# Patient Record
Sex: Female | Born: 1995 | Race: Black or African American | Hispanic: No | Marital: Single | Smoking: Never smoker
Health system: Southern US, Community
[De-identification: ages and names within clinical notes are randomized; demographics above are authoritative.]

## PROBLEM LIST (undated history)

## (undated) DIAGNOSIS — F419 Anxiety disorder, unspecified: Secondary | ICD-10-CM

## (undated) DIAGNOSIS — R519 Headache, unspecified: Secondary | ICD-10-CM

## (undated) DIAGNOSIS — E611 Iron deficiency: Secondary | ICD-10-CM

## (undated) HISTORY — DX: Anxiety disorder, unspecified: F41.9

## (undated) HISTORY — PX: NO PAST SURGERIES: SHX2092

---

## 2019-05-16 ENCOUNTER — Emergency Department (HOSPITAL_COMMUNITY)
Admission: EM | Admit: 2019-05-16 | Discharge: 2019-05-16 | Disposition: A | Discharge status: Home / Self Care | Payer: Medicaid Other | Attending: Emergency Medicine | Admitting: Emergency Medicine

## 2019-05-16 ENCOUNTER — Emergency Department (HOSPITAL_COMMUNITY): Payer: Medicaid Other

## 2019-05-16 ENCOUNTER — Other Ambulatory Visit: Payer: Self-pay

## 2019-05-16 DIAGNOSIS — Y93I9 Activity, other involving external motion: Secondary | ICD-10-CM | POA: Insufficient documentation

## 2019-05-16 DIAGNOSIS — S46911A Strain of unspecified muscle, fascia and tendon at shoulder and upper arm level, right arm, initial encounter: Secondary | ICD-10-CM | POA: Diagnosis not present

## 2019-05-16 DIAGNOSIS — Y92410 Unspecified street and highway as the place of occurrence of the external cause: Secondary | ICD-10-CM | POA: Insufficient documentation

## 2019-05-16 DIAGNOSIS — M545 Low back pain, unspecified: Secondary | ICD-10-CM

## 2019-05-16 DIAGNOSIS — R519 Headache, unspecified: Secondary | ICD-10-CM | POA: Diagnosis not present

## 2019-05-16 DIAGNOSIS — Y999 Unspecified external cause status: Secondary | ICD-10-CM | POA: Insufficient documentation

## 2019-05-16 LAB — PREGNANCY, URINE: Preg Test, Ur: NEGATIVE

## 2019-05-16 MED ORDER — ACETAMINOPHEN 500 MG PO TABS
500.0000 mg | ORAL_TABLET | Freq: Once | ORAL | Status: AC
  Administered 2019-05-16: 500 mg via ORAL
  Filled 2019-05-16: qty 1

## 2019-05-16 MED ORDER — CYCLOBENZAPRINE HCL 10 MG PO TABS
10.0000 mg | ORAL_TABLET | Freq: Every day | ORAL | 0 refills | Status: DC
Start: 2019-05-16 — End: 2019-10-03
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
## 2019-05-16 NOTE — Discharge Instructions (Addendum)
Per our discussion, I am going to prescribe you a short course of Flexeril.  This is a muscle relaxer that is also a strong sedative.  You can take this once at night with dinner.  It will help with your pain as well as difficulty sleeping.  Please do not mix this medication with alcohol or operate a motor vehicle after taking it.  You can take Tylenol and ibuprofen as needed for management of your pain.  Please follow the instructions on the bottle.  I would also recommend Biofreeze and Voltaren gel.  You can apply these to the areas that are bothering you.  Please follow-up with your primary care provider regarding this visit as well as your  symptoms.  Feel free to return to the emergency department with any new or worsening symptoms.  It was a pleasure to meet you.

## 2019-05-16 NOTE — ED Notes (Signed)
Patient verbalizes understanding of discharge instructions. Opportunity for questioning and answers were provided. Armband removed by staff, pt discharged from ED ambulatory.   

## 2019-05-16 NOTE — ED Notes (Signed)
Pt transported to xray 

## 2019-06-27 ENCOUNTER — Other Ambulatory Visit: Payer: Self-pay

## 2019-06-27 ENCOUNTER — Encounter (HOSPITAL_COMMUNITY): Payer: Self-pay | Admitting: *Deleted

## 2019-06-27 ENCOUNTER — Emergency Department (HOSPITAL_COMMUNITY)
Admission: EM | Admit: 2019-06-27 | Discharge: 2019-06-27 | Disposition: A | Discharge status: Home / Self Care | Payer: Medicaid Other | Attending: Emergency Medicine | Admitting: Emergency Medicine

## 2019-06-27 DIAGNOSIS — Z5321 Procedure and treatment not carried out due to patient leaving prior to being seen by health care provider: Secondary | ICD-10-CM | POA: Insufficient documentation

## 2019-06-27 DIAGNOSIS — R2231 Localized swelling, mass and lump, right upper limb: Secondary | ICD-10-CM | POA: Diagnosis not present

## 2019-06-27 DIAGNOSIS — R200 Anesthesia of skin: Secondary | ICD-10-CM | POA: Diagnosis not present

## 2019-06-27 LAB — BASIC METABOLIC PANEL
Anion gap: 8 (ref 5–15)
BUN: 6 mg/dL (ref 6–20)
CO2: 25 mmol/L (ref 22–32)
Calcium: 9 mg/dL (ref 8.9–10.3)
Chloride: 106 mmol/L (ref 98–111)
Creatinine, Ser: 0.81 mg/dL (ref 0.44–1.00)
GFR calc Af Amer: >60 mL/min (ref >60)
GFR calc non Af Amer: >60 mL/min (ref >60)
Glucose, Bld: 86 mg/dL (ref 70–99)
Potassium: 3.3 mmol/L — ABNORMAL LOW (ref 3.5–5.1)
Sodium: 139 mmol/L (ref 135–145)

## 2019-06-27 LAB — CBC
HCT: 40.3 % (ref 36.0–46.0)
Hemoglobin: 13.2 g/dL (ref 12.0–15.0)
MCH: 29.7 pg (ref 26.0–34.0)
MCHC: 32.8 g/dL (ref 30.0–36.0)
MCV: 90.6 fL (ref 80.0–100.0)
Platelets: 347 10*3/uL (ref 150–400)
RBC: 4.45 MIL/uL (ref 3.87–5.11)
RDW: 12.4 % (ref 11.5–15.5)
WBC: 8.7 10*3/uL (ref 4.0–10.5)
nRBC: 0 % (ref 0.0–0.2)

## 2019-06-27 LAB — I-STAT BETA HCG BLOOD, ED (MC, WL, AP ONLY): I-stat hCG, quantitative: <5 m[IU]/mL (ref <5)

## 2019-06-27 MED ORDER — SODIUM CHLORIDE 0.9% FLUSH: 3.0000 mL | Freq: Once | INTRAVENOUS | Status: DC

## 2019-06-27 NOTE — ED Notes (Signed)
Pt LWBS. Pt stated she couldn't wait any longer.  

## 2019-06-27 NOTE — ED Triage Notes (Signed)
Pt reports that she has hx of same occurring, gets numbness to her right arm with swelling and also occ numbness to her right leg "like it falls asleep". In past this only occurred when pregnant, she would go to [facility] and get iv fluids then dc home. No acute distres is noted at triage.

## 2019-07-11 ENCOUNTER — Encounter (HOSPITAL_COMMUNITY): Payer: Self-pay | Admitting: Emergency Medicine

## 2019-07-11 ENCOUNTER — Other Ambulatory Visit: Payer: Self-pay

## 2019-07-11 ENCOUNTER — Emergency Department (HOSPITAL_COMMUNITY)
Admission: EM | Admit: 2019-07-11 | Discharge: 2019-07-11 | Disposition: A | Discharge status: Home / Self Care | Payer: Medicaid Other | Attending: Emergency Medicine | Admitting: Emergency Medicine

## 2019-07-11 ENCOUNTER — Emergency Department (HOSPITAL_COMMUNITY): Payer: Medicaid Other

## 2019-07-11 DIAGNOSIS — R1032 Left lower quadrant pain: Secondary | ICD-10-CM | POA: Insufficient documentation

## 2019-07-11 DIAGNOSIS — N10 Acute tubulo-interstitial nephritis: Secondary | ICD-10-CM | POA: Diagnosis not present

## 2019-07-11 DIAGNOSIS — N12 Tubulo-interstitial nephritis, not specified as acute or chronic: Secondary | ICD-10-CM

## 2019-07-11 DIAGNOSIS — R1031 Right lower quadrant pain: Secondary | ICD-10-CM | POA: Diagnosis present

## 2019-07-11 LAB — PREGNANCY, URINE: Preg Test, Ur: NEGATIVE

## 2019-07-11 LAB — CBC
HCT: 39.5 % (ref 36.0–46.0)
Hemoglobin: 13 g/dL (ref 12.0–15.0)
MCH: 29.5 pg (ref 26.0–34.0)
MCHC: 32.9 g/dL (ref 30.0–36.0)
MCV: 89.8 fL (ref 80.0–100.0)
Platelets: 260 10*3/uL (ref 150–400)
RBC: 4.4 MIL/uL (ref 3.87–5.11)
RDW: 12.5 % (ref 11.5–15.5)
WBC: 12 10*3/uL — ABNORMAL HIGH (ref 4.0–10.5)
nRBC: 0 % (ref 0.0–0.2)

## 2019-07-11 LAB — COMPREHENSIVE METABOLIC PANEL
ALT: 10 U/L (ref 0–44)
AST: 13 U/L — ABNORMAL LOW (ref 15–41)
Albumin: 3.3 g/dL — ABNORMAL LOW (ref 3.5–5.0)
Alkaline Phosphatase: 81 U/L (ref 38–126)
Anion gap: 8 (ref 5–15)
BUN: 5 mg/dL — ABNORMAL LOW (ref 6–20)
CO2: 22 mmol/L (ref 22–32)
Calcium: 8.6 mg/dL — ABNORMAL LOW (ref 8.9–10.3)
Chloride: 107 mmol/L (ref 98–111)
Creatinine, Ser: 0.84 mg/dL (ref 0.44–1.00)
GFR calc Af Amer: >60 mL/min (ref >60)
GFR calc non Af Amer: >60 mL/min (ref >60)
Glucose, Bld: 102 mg/dL — ABNORMAL HIGH (ref 70–99)
Potassium: 3.4 mmol/L — ABNORMAL LOW (ref 3.5–5.1)
Sodium: 137 mmol/L (ref 135–145)
Total Bilirubin: 1.2 mg/dL (ref 0.3–1.2)
Total Protein: 7.7 g/dL (ref 6.5–8.1)

## 2019-07-11 LAB — URINALYSIS, ROUTINE W REFLEX MICROSCOPIC
Bilirubin Urine: NEGATIVE
Glucose, UA: NEGATIVE mg/dL
Ketones, ur: NEGATIVE mg/dL
Nitrite: NEGATIVE
Protein, ur: 30 mg/dL — AB
Specific Gravity, Urine: 1.01 (ref 1.005–1.030)
WBC, UA: >50 WBC/hpf — ABNORMAL HIGH (ref 0–5)
pH: 7 (ref 5.0–8.0)

## 2019-07-11 LAB — GRAM STAIN: Gram Stain: NONE SEEN

## 2019-07-11 MED ORDER — ACETAMINOPHEN 500 MG PO TABS
1000.0000 mg | ORAL_TABLET | Freq: Once | ORAL | Status: AC
  Administered 2019-07-11: 1000 mg via ORAL
  Filled 2019-07-11: qty 2

## 2019-07-11 MED ORDER — LACTATED RINGERS IV BOLUS
1000.0000 mL | Freq: Once | INTRAVENOUS | Status: AC
  Administered 2019-07-11: 1000 mL via INTRAVENOUS

## 2019-07-11 MED ORDER — MORPHINE SULFATE (PF) 4 MG/ML IV SOLN
4.0000 mg | Freq: Once | INTRAVENOUS | Status: AC
  Administered 2019-07-11: 4 mg via INTRAVENOUS
  Filled 2019-07-11: qty 1

## 2019-07-11 MED ORDER — CEPHALEXIN 500 MG PO CAPS
500.0000 mg | ORAL_CAPSULE | Freq: Four times a day (QID) | ORAL | 0 refills | Status: AC
Start: 2019-07-11 — End: 2019-07-18

## 2019-07-11 NOTE — ED Notes (Signed)
Patient transported to CT 

## 2019-07-11 NOTE — ED Notes (Signed)
Patient verbalizes understanding of discharge instructions. Opportunity for questioning and answers were provided. Armband removed by staff, pt discharged from ED to home 

## 2019-07-11 NOTE — ED Triage Notes (Signed)
Bilateral kidney pain with burning sensation and pain with urination.

## 2019-07-11 NOTE — ED Provider Notes (Signed)
[facility] EMERGENCY DEPARTMENT Provider Note   CSN: [id number] Arrival date & time: 07/11/19  1210     History Chief Complaint  Patient presents with  . Flank Pain    Bailey [name] is a 24 y.o. female.  The history is provided by the patient.  Flank Pain This is a new problem. The current episode started more than 2 days ago (onset monday). The problem occurs constantly. The problem has been gradually worsening (current 10/10 pain). Associated symptoms include abdominal pain. Pertinent negatives include no chest pain, no headaches and no shortness of breath. Nothing relieves the symptoms. She has tried acetaminophen for the symptoms. The treatment provided mild relief.  Dysuria Pain quality:  Burning Duration:  5 days Timing:  Constant Chronicity:  New Urinary symptoms: discolored urine, foul-smelling urine and hematuria   Associated symptoms: abdominal pain, fever, flank pain and nausea   Associated symptoms: no vomiting   Associated symptoms comment:  Febrile on arrival to ED, denies measured fever earlier in week  Risk factors: hx of urolithiasis and sexually active   Risk factors comment:  Pregnancy status unknown  Hx of prior Renal stone ~3 yrs ago that did not require surgical intervention. Pt does endorse prior UTI with similar dysuria.      Past Medical History:  Diagnosis Date  . Anxiety     There are no problems to display for this patient.   History reviewed. No pertinent surgical history.   OB History   No obstetric history on file.     No family history on file.  Social History   Tobacco Use  . Smoking status: Never Smoker  Substance Use Topics  . Alcohol use: Yes  . Drug use: Yes    Types: Marijuana    Home Medications Prior to Admission medications   Medication Sig Start Date End Date Taking? Authorizing Provider  cephALEXin (KEFLEX) 500 MG capsule Take 1 capsule (500 mg total) by mouth 4 (four) times daily for 7  days. 07/11/19 07/18/19  Ramelo Oetken, Percell Belt, MD  cyclobenzaprine (FLEXERIL) 10 MG tablet Take 1 tablet (10 mg total) by mouth at bedtime. Patient not taking: Reported on 07/11/2019 05/16/19   Placido Sou, PA-C    Allergies    Blackberry [rubus fruticosus], Blueberry [vaccinium angustifolium], Other, Raspberry, and Strawberry extract  Review of Systems   Review of Systems  Constitutional: Positive for fever. Negative for chills.  HENT: Negative for congestion, rhinorrhea, sneezing and sore throat.   Respiratory: Negative for cough and shortness of breath.   Cardiovascular: Negative for chest pain and leg swelling.  Gastrointestinal: Positive for abdominal pain, constipation and nausea. Negative for blood in stool and vomiting.  Genitourinary: Positive for dysuria, flank pain and hematuria. Negative for vaginal bleeding.  Musculoskeletal: Positive for back pain.  Skin: Negative for rash and wound.  Neurological: Negative for dizziness and headaches.  Psychiatric/Behavioral: Negative.     Physical Exam Updated Vital Signs BP 116/69 (BP Location: Right Arm)   Pulse 86   Temp 99 F (37.2 C) (Oral)   Resp 18   Ht 5\' 5"  (1.651 m)   Wt 112.9 kg   LMP 06/20/2019   SpO2 99%   BMI 41.44 kg/m    Physical Exam Vitals and nursing note reviewed.  Constitutional:      General: She is not in acute distress.    Appearance: Normal appearance. She is well-developed. She is obese. She is not ill-appearing, toxic-appearing or diaphoretic.  HENT:  Head: Normocephalic and atraumatic.     Mouth/Throat:     Mouth: Mucous membranes are moist.  Eyes:     Conjunctiva/sclera: Conjunctivae normal.  Cardiovascular:     Rate and Rhythm: Normal rate and regular rhythm.     Heart sounds: No murmur heard.   Pulmonary:     Effort: Pulmonary effort is normal. No respiratory distress.     Breath sounds: Normal breath sounds.  Abdominal:     General: There is no distension.     Palpations: Abdomen is  soft.     Tenderness: There is abdominal tenderness. There is right CVA tenderness and left CVA tenderness. There is no guarding or rebound.  Musculoskeletal:     Cervical back: Neck supple.     Right lower leg: No edema.     Left lower leg: No edema.  Skin:    General: Skin is warm and dry.     Findings: No rash.  Neurological:     General: No focal deficit present.     Mental Status: She is alert. Mental status is at baseline.  Psychiatric:        Mood and Affect: Mood normal.        Behavior: Behavior normal.     ED Results / Procedures / Treatments   Labs (all labs ordered are listed, but only abnormal results are displayed) Labs Reviewed  COMPREHENSIVE METABOLIC PANEL - Abnormal; Notable for the following components:      Result Value   Potassium 3.4 (*)    Glucose, Bld 102 (*)    BUN 5 (*)    Calcium 8.6 (*)    Albumin 3.3 (*)    AST 13 (*)    All other components within normal limits  CBC - Abnormal; Notable for the following components:   WBC 12.0 (*)    All other components within normal limits  URINALYSIS, ROUTINE W REFLEX MICROSCOPIC - Abnormal; Notable for the following components:   APPearance HAZY (*)    Hgb urine dipstick MODERATE (*)    Protein, ur 30 (*)    Leukocytes,Ua LARGE (*)    WBC, UA >50 (*)    Bacteria, UA RARE (*)    All other components within normal limits  GRAM STAIN  URINE CULTURE  PREGNANCY, URINE    EKG None  Radiology CT ABDOMEN PELVIS WO CONTRAST  Result Date: 07/11/2019 CLINICAL DATA:  Bilateral kidney pain with burning sensation and pain with urination EXAM: CT ABDOMEN AND PELVIS WITHOUT CONTRAST TECHNIQUE: Multidetector CT imaging of the abdomen and pelvis was performed following the standard protocol without IV contrast. Unenhanced CT was performed per clinician order. Lack of IV contrast limits sensitivity and specificity, especially for evaluation of abdominal/pelvic solid viscera. COMPARISON:  Lumbar radiographs 05/16/2019  FINDINGS: Lower chest: Solitary 4 mm ground-glass opacity in the right middle lobe (5/2), likely post infectious or inflammatory in a patient of this age. Lung bases otherwise clear. Normal heart size. No pericardial effusion. Hepatobiliary: No visible liver lesions. Normal attenuation. Smooth liver surface contour. Normal gallbladder and biliary tree without visible calcified gallstone. Pancreas: Unremarkable. No pancreatic ductal dilatation or surrounding inflammatory changes. Spleen: Normal in size without focal abnormality. Adrenals/Urinary Tract: Normal adrenal glands. Kidneys symmetric in size without visible concerning renal lesion. No significant perinephric stranding. No visible urolithiasis or hydronephrosis. Urinary bladder is unremarkable. Stomach/Bowel: Distal esophagus, stomach and duodenal sweep are unremarkable. No small bowel wall thickening or dilatation. No evidence of obstruction. A normal appendix  is visualized. No colonic dilatation or wall thickening. Vascular/Lymphatic: No significant vascular findings are present. No enlarged abdominal or pelvic lymph nodes. Reproductive: Anteverted uterus. No concerning adnexal lesions. Dominant follicle in the right ovary, no follow-up imaging recommended. Other: No abdominopelvic free fluid or free gas. No bowel containing hernias. Musculoskeletal: No acute osseous abnormality or suspicious osseous lesion. IMPRESSION: 1. No acute CT findings to explain the patient's symptoms. Specifically, no visible urolithiasis or hydronephrosis. 2. Solitary 4 mm ground-glass opacity in the right middle lobe, likely post infectious or inflammatory in a patient of this age. Consider correlation with chest radiograph. Electronically Signed   By: Kreg Shropshire M.D.   On: 07/11/2019 19:14    Procedures Procedures (including critical care time)  Medications Ordered in ED Medications  acetaminophen (TYLENOL) tablet 1,000 mg (1,000 mg Oral Given 07/11/19 1219)  lactated  ringers bolus 1,000 mL (0 mLs Intravenous Stopped 07/11/19 1852)  morphine 4 MG/ML injection 4 mg (4 mg Intravenous Given 07/11/19 1632)  morphine 4 MG/ML injection 4 mg (4 mg Intravenous Given 07/11/19 2003)    ED Course  I have reviewed the triage vital signs and the nursing notes.  Pertinent labs & imaging results that were available during my care of the patient were reviewed by me and considered in my medical decision making (see chart for details).    MDM Rules/Calculators/A&P                          Pt is an otherwise healthy 24 y.o. female who presents w/ b/l CVA tenderness, lower abdominal pain, b/l flank pain, concerning for possible kidney stone. Patient presents with dysuria and hematuria. Hx of one prior renal stone.   Pt febrile here to 100.8, otherwise HDS. Patient complaining of severe 10/10 pain. Physical exam significant for b/l CVA tenderness. Abdomen w generalized tenderness however soft, non-distended, no concern for peritonitis. No overlying rash concerning for shingles. Presentation concerning for UTI/pyelonephritis vs. renal stone. Other emergent causes of back pain/flank pain considered however would be inconsistent with history and physical, considered low risk.  Imaging obtained without evidence of stone , no hydronephrosis. Pain treated with IVF, IV pain medicine. Labs and imaging reviewed by myself and considered in medical decision making if ordered.  Imaging interpreted by radiology. UA consistent with infection, presence of fever on arrival concerning for pyelonephritis. CT imaging was not significant for perinephric stranding, abscess.   Will continue management for pyelonephritis as outpatient. Recommend follow-up with PCP for persistent symptoms.  Will discharge with rx keflex x7 days. Recommended considerable fluid hydration. Patient voices understanding. Strict return precautions given. Discharged to home in stable condition.   The plan for this patient was  discussed with Dr. Jeraldine Loots, who voiced agreement and who oversaw evaluation and treatment of this patient.   Final Clinical Impression(s) / ED Diagnoses Final diagnoses:  Pyelonephritis    Rx / DC Orders ED Discharge Orders         Ordered    cephALEXin (KEFLEX) 500 MG capsule  4 times daily     Discontinue  Reprint     07/11/19 Grace Isaac, MD 07/11/19 1308    Gerhard Munch, MD 07/12/19 [phone]

## 2019-07-14 LAB — URINE CULTURE: Culture: >=100000 — AB

## 2019-10-03 ENCOUNTER — Ambulatory Visit (HOSPITAL_COMMUNITY)
Admission: EM | Admit: 2019-10-03 | Discharge: 2019-10-03 | Disposition: A | Discharge status: Home / Self Care | Payer: Medicaid Other | Attending: Family Medicine | Admitting: Family Medicine

## 2019-10-03 ENCOUNTER — Encounter (HOSPITAL_COMMUNITY): Payer: Self-pay | Admitting: *Deleted

## 2019-10-03 ENCOUNTER — Other Ambulatory Visit: Payer: Self-pay

## 2019-10-03 DIAGNOSIS — N3090 Cystitis, unspecified without hematuria: Secondary | ICD-10-CM | POA: Diagnosis present

## 2019-10-03 DIAGNOSIS — N898 Other specified noninflammatory disorders of vagina: Secondary | ICD-10-CM | POA: Diagnosis present

## 2019-10-03 HISTORY — DX: Iron deficiency: E61.1

## 2019-10-03 LAB — POCT URINALYSIS DIPSTICK, ED / UC
Bilirubin Urine: NEGATIVE
Glucose, UA: NEGATIVE mg/dL
Hgb urine dipstick: NEGATIVE
Ketones, ur: NEGATIVE mg/dL
Nitrite: POSITIVE — AB
Protein, ur: NEGATIVE mg/dL
Specific Gravity, Urine: >=1.03 (ref 1.005–1.030)
Urobilinogen, UA: 0.2 mg/dL (ref 0.0–1.0)
pH: 6 (ref 5.0–8.0)

## 2019-10-03 MED ORDER — METRONIDAZOLE 500 MG PO TABS
500.0000 mg | ORAL_TABLET | Freq: Two times a day (BID) | ORAL | 0 refills | Status: DC
Start: 2019-10-03 — End: 2019-10-21

## 2019-10-03 MED ORDER — CEPHALEXIN 500 MG PO CAPS
500.0000 mg | ORAL_CAPSULE | Freq: Two times a day (BID) | ORAL | 0 refills | Status: DC
Start: 2019-10-03 — End: 2019-10-21

## 2019-10-03 NOTE — ED Provider Notes (Signed)
[facility]   [id number] 10/03/19 Arrival Time: 1103  ASSESSMENT & PLAN:  1. Vaginal itching   2. Cystitis     Reports negative gonorrhea and chlamydia testing yesterday. Desires empiric BV treatment and UTI treatment. Urine culture sent.   Discharge Instructions     We have sent testing for sexually transmitted infections as well as for BV and yeast. We will notify you of any positive results once they are received. If required, we will prescribe any medications you might need.  Please refrain from all sexual activity for at least the next seven days.     Without s/s of PID.  Labs Reviewed  POCT URINALYSIS DIPSTICK, ED / UC - Abnormal; Notable for the following components:      Result Value   Nitrite POSITIVE (*)    Leukocytes,Ua SMALL (*)    All other components within normal limits  CERVICOVAGINAL ANCILLARY ONLY    Pending: Unresulted Labs (From admission, onward)         None       Will notify of any positive results. Instructed to refrain from sexual activity for at least seven days.  Reviewed expectations re: course of current medical issues. Questions answered. Outlined signs and symptoms indicating need for more acute intervention. Patient verbalized understanding. After Visit Summary given.   SUBJECTIVE:  Bailey [name] is a 24 y.o. female who presents with complaint of vaginal itching and feeling like she has a UTI. H/O UTI; urgency and mild dysuria. No specific vaginal discharge. No specific aggravating or alleviating factors reported. Afebrile. No abdominal or pelvic pain. Normal PO intake wihout n/v. No genital rashes or lesions. Reports that she is sexually active with single female partner. OTC treatment: none; h/o BV with vaginal itching.   OBJECTIVE:  Vitals:   10/03/19 1324  BP: 120/68  Pulse: 90  Resp: 20  Temp: 98.2 F (36.8 C)  TempSrc: Oral  SpO2: 99%     General appearance: alert, cooperative, appears stated age  and no distress Lungs: unlabored respirations; speaks full sentences without difficulty Back: no CVA tenderness; FROM at waist Abdomen: soft, non-tender GU: deferred Skin: warm and dry Psychological: alert and cooperative; normal mood and affect.  Results for orders placed or performed during the hospital encounter of 10/03/19  POC Urinalysis dipstick  Result Value Ref Range   Glucose, UA NEGATIVE NEGATIVE mg/dL   Bilirubin Urine NEGATIVE NEGATIVE   Ketones, ur NEGATIVE NEGATIVE mg/dL   Specific Gravity, Urine >=1.030 1.005 - 1.030   Hgb urine dipstick NEGATIVE NEGATIVE   pH 6.0 5.0 - 8.0   Protein, ur NEGATIVE NEGATIVE mg/dL   Urobilinogen, UA 0.2 0.0 - 1.0 mg/dL   Nitrite POSITIVE (A) NEGATIVE   Leukocytes,Ua SMALL (A) NEGATIVE    Labs Reviewed  POCT URINALYSIS DIPSTICK, ED / UC - Abnormal; Notable for the following components:      Result Value   Nitrite POSITIVE (*)    Leukocytes,Ua SMALL (*)    All other components within normal limits  CERVICOVAGINAL ANCILLARY ONLY    Allergies  Allergen Reactions  . Blackberry [Rubus Fruticosus] Anaphylaxis  . Blueberry [Vaccinium Angustifolium] Anaphylaxis  . Other Anaphylaxis    ALLERGIC TO ALL BERRIES  . Raspberry Anaphylaxis  . Strawberry Extract Anaphylaxis    Past Medical History:  Diagnosis Date  . Anxiety   . Iron deficiency    Family History  Problem Relation Age of Onset  . Healthy Mother   . Healthy Father  Social History   Socioeconomic History  . Marital status: Single    Spouse name: Not on file  . Number of children: Not on file  . Years of education: Not on file  . Highest education level: Not on file  Occupational History  . Not on file  Tobacco Use  . Smoking status: Never Smoker  . Smokeless tobacco: Never Used  Substance and Sexual Activity  . Alcohol use: Yes    Comment: no drinks since August  . Drug use: Yes    Types: Marijuana  . Sexual activity: Yes    Birth control/protection:  None  Other Topics Concern  . Not on file  Social History Narrative  . Not on file   Social Determinants of Health   Financial Resource Strain:   . Difficulty of Paying Living Expenses: Not on file  Food Insecurity:   . Worried About Programme researcher, broadcasting/film/video in the Last Year: Not on file  . Ran Out of Food in the Last Year: Not on file  Transportation Needs:   . Lack of Transportation (Medical): Not on file  . Lack of Transportation (Non-Medical): Not on file  Physical Activity:   . Days of Exercise per Week: Not on file  . Minutes of Exercise per Session: Not on file  Stress:   . Feeling of Stress : Not on file  Social Connections:   . Frequency of Communication with Friends and Family: Not on file  . Frequency of Social Gatherings with Friends and Family: Not on file  . Attends Religious Services: Not on file  . Active Member of Clubs or Organizations: Not on file  . Attends Banker Meetings: Not on file  . Marital Status: Not on file  Intimate Partner Violence:   . Fear of Current or Ex-Partner: Not on file  . Emotionally Abused: Not on file  . Physically Abused: Not on file  . Sexually Abused: Not on file          Mardella Layman, MD 10/03/19 [phone]

## 2019-10-03 NOTE — ED Triage Notes (Addendum)
Patient in with complaints of urinary frequency and foul odor with urination. Patient states she has experienced vaginal itching x 3 days.  Patient states that she did she did have thick white vaginal discharge this morning.  Patient states that she went to the [facility] [place] on yesterday and was told that she was [redacted] weeks pregnant. Patient states she was checked for STDs on yesterday and was told that she was negative.Patient states that she has reoccurring UTIs. Patient states that she has a hx of BV and would like to checked for BV. Patient states that her boyfriend had bloody sperm that she came in contact with after ejaculation during sexual intercourse.

## 2019-10-03 NOTE — Discharge Instructions (Signed)
We have sent testing for sexually transmitted infections as well as for BV and yeast. We will notify you of any positive results once they are received. If required, we will prescribe any medications you might need.  Please refrain from all sexual activity for at least the next seven days.

## 2019-10-04 ENCOUNTER — Telehealth (HOSPITAL_COMMUNITY): Payer: Self-pay | Admitting: Emergency Medicine

## 2019-10-04 LAB — CERVICOVAGINAL ANCILLARY ONLY
Bacterial Vaginitis (gardnerella): POSITIVE — AB
Candida Glabrata: NEGATIVE
Candida Vaginitis: POSITIVE — AB
Chlamydia: NEGATIVE
Comment: NEGATIVE
Comment: NEGATIVE
Comment: NEGATIVE
Comment: NEGATIVE
Comment: NEGATIVE
Comment: NORMAL
Neisseria Gonorrhea: NEGATIVE
Trichomonas: NEGATIVE

## 2019-10-04 MED ORDER — TERCONAZOLE 0.4 % VA CREA: 1.0000 | TOPICAL_CREAM | Freq: Every day | VAGINAL | 0 refills | Status: AC

## 2019-10-05 LAB — URINE CULTURE: Culture: >=100000 — AB

## 2019-10-21 ENCOUNTER — Other Ambulatory Visit: Payer: Self-pay

## 2019-10-21 ENCOUNTER — Encounter (HOSPITAL_COMMUNITY): Payer: Self-pay

## 2019-10-21 ENCOUNTER — Inpatient Hospital Stay (HOSPITAL_COMMUNITY)
Admission: AD | Admit: 2019-10-21 | Discharge: 2019-10-21 | Disposition: A | Discharge status: Home / Self Care | Payer: Medicaid Other | Attending: Family Medicine | Admitting: Family Medicine

## 2019-10-21 DIAGNOSIS — R509 Fever, unspecified: Secondary | ICD-10-CM | POA: Diagnosis not present

## 2019-10-21 DIAGNOSIS — R55 Syncope and collapse: Secondary | ICD-10-CM | POA: Insufficient documentation

## 2019-10-21 DIAGNOSIS — Z3A10 10 weeks gestation of pregnancy: Secondary | ICD-10-CM

## 2019-10-21 DIAGNOSIS — U071 COVID-19: Secondary | ICD-10-CM | POA: Diagnosis not present

## 2019-10-21 DIAGNOSIS — O98511 Other viral diseases complicating pregnancy, first trimester: Secondary | ICD-10-CM | POA: Diagnosis not present

## 2019-10-21 DIAGNOSIS — R531 Weakness: Secondary | ICD-10-CM | POA: Diagnosis not present

## 2019-10-21 DIAGNOSIS — R109 Unspecified abdominal pain: Secondary | ICD-10-CM | POA: Diagnosis not present

## 2019-10-21 DIAGNOSIS — Z79899 Other long term (current) drug therapy: Secondary | ICD-10-CM | POA: Insufficient documentation

## 2019-10-21 DIAGNOSIS — O26891 Other specified pregnancy related conditions, first trimester: Secondary | ICD-10-CM | POA: Diagnosis not present

## 2019-10-21 LAB — RESPIRATORY PANEL BY RT PCR (FLU A&B, COVID)
Influenza A by PCR: NEGATIVE
Influenza B by PCR: NEGATIVE
SARS Coronavirus 2 by RT PCR: POSITIVE — AB

## 2019-10-21 LAB — WET PREP, GENITAL
Clue Cells Wet Prep HPF POC: NONE SEEN
Sperm: NONE SEEN
Trich, Wet Prep: NONE SEEN
Yeast Wet Prep HPF POC: NONE SEEN

## 2019-10-21 MED ORDER — EPINEPHRINE 0.3 MG/0.3ML IJ SOAJ
0.3000 mg | Freq: Once | INTRAMUSCULAR | Status: DC | PRN
  Filled 2019-10-21: qty 0.6

## 2019-10-21 MED ORDER — SODIUM CHLORIDE 0.9 % IV SOLN
Freq: Once | INTRAVENOUS | Status: AC
  Filled 2019-10-21: qty 20

## 2019-10-21 MED ORDER — METHYLPREDNISOLONE SODIUM SUCC 125 MG IJ SOLR: 125.0000 mg | Freq: Once | INTRAMUSCULAR | Status: DC | PRN

## 2019-10-21 MED ORDER — FAMOTIDINE IN NACL 20-0.9 MG/50ML-% IV SOLN: 20.0000 mg | Freq: Once | INTRAVENOUS | Status: DC | PRN

## 2019-10-21 MED ORDER — ALBUTEROL SULFATE HFA 108 (90 BASE) MCG/ACT IN AERS: 2.0000 | INHALATION_SPRAY | Freq: Once | RESPIRATORY_TRACT | Status: DC | PRN

## 2019-10-21 MED ORDER — SODIUM CHLORIDE 0.9 % IV SOLN
INTRAVENOUS | Status: DC | PRN
  Administered 2019-10-21: 1000 mL via INTRAVENOUS

## 2019-10-21 MED ORDER — ACETAMINOPHEN 500 MG PO TABS
1000.0000 mg | ORAL_TABLET | Freq: Once | ORAL | Status: AC
  Administered 2019-10-21: 1000 mg via ORAL
  Filled 2019-10-21: qty 2

## 2019-10-21 MED ORDER — DIPHENHYDRAMINE HCL 50 MG/ML IJ SOLN: 50.0000 mg | Freq: Once | INTRAMUSCULAR | Status: DC | PRN

## 2019-10-21 MED ORDER — BUTALBITAL-APAP-CAFFEINE 50-325-40 MG PO TABS
2.0000 | ORAL_TABLET | Freq: Once | ORAL | Status: AC
  Administered 2019-10-21: 2 via ORAL
  Filled 2019-10-21: qty 2

## 2019-10-21 MED ORDER — SODIUM CHLORIDE 0.9 % IV SOLN: 1200.0000 mg | Freq: Once | INTRAVENOUS | Status: DC

## 2019-10-21 NOTE — ED Triage Notes (Signed)
Pt has multiple complaints: Headache and generalized body aches since yesterday.  Pt also reports a syncopal episode yesterday.  Pt also reports slimy, green discharge. Pt is [redacted] weeks pregnant, hx of high risk pregnancy.  Pt has not been vaccinated for COVID/ Pt a.o, tearful in triage

## 2019-10-21 NOTE — Discharge Instructions (Signed)
COVID-19 COVID-19 is a respiratory infection that is caused by a virus called severe acute respiratory syndrome coronavirus 2 (SARS-CoV-2). The disease is also known as coronavirus disease or novel coronavirus. In some people, the virus may not cause any symptoms. In others, it may cause a serious infection. The infection can get worse quickly and can lead to complications, such as:  Pneumonia, or infection of the lungs.  Acute respiratory distress syndrome or ARDS. This is a condition in which fluid build-up in the lungs prevents the lungs from filling with air and passing oxygen into the blood.  Acute respiratory failure. This is a condition in which there is not enough oxygen passing from the lungs to the body or when carbon dioxide is not passing from the lungs out of the body.  Sepsis or septic shock. This is a serious bodily reaction to an infection.  Blood clotting problems.  Secondary infections due to bacteria or fungus.  Organ failure. This is when your body's organs stop working. The virus that causes COVID-19 is contagious. This means that it can spread from person to person through droplets from coughs and sneezes (respiratory secretions). What are the causes? This illness is caused by a virus. You may catch the virus by:  Breathing in droplets from an infected person. Droplets can be spread by a person breathing, speaking, singing, coughing, or sneezing.  Touching something, like a table or a doorknob, that was exposed to the virus (contaminated) and then touching your mouth, nose, or eyes. What increases the risk? Risk for infection You are more likely to be infected with this virus if you:  Are within 6 feet (2 meters) of a person with COVID-19.  Provide care for or live with a person who is infected with COVID-19.  Spend time in crowded indoor spaces or live in shared housing. Risk for serious illness You are more likely to become seriously ill from the virus if you:   Are 50 years of age or older. The higher your age, the more you are at risk for serious illness.  Live in a nursing home or long-term care facility.  Have cancer.  Have a long-term (chronic) disease such as: ? Chronic lung disease, including chronic obstructive pulmonary disease or asthma. ? A long-term disease that lowers your body's ability to fight infection (immunocompromised). ? Heart disease, including heart failure, a condition in which the arteries that lead to the heart become narrow or blocked (coronary artery disease), a disease which makes the heart muscle thick, weak, or stiff (cardiomyopathy). ? Diabetes. ? Chronic kidney disease. ? Sickle cell disease, a condition in which red blood cells have an abnormal "sickle" shape. ? Liver disease.  Are obese. What are the signs or symptoms? Symptoms of this condition can range from mild to severe. Symptoms may appear any time from 2 to 14 days after being exposed to the virus. They include:  A fever or chills.  A cough.  Difficulty breathing.  Headaches, body aches, or muscle aches.  Runny or stuffy (congested) nose.  A sore throat.  New loss of taste or smell. Some people may also have stomach problems, such as nausea, vomiting, or diarrhea. Other people may not have any symptoms of COVID-19. How is this diagnosed? This condition may be diagnosed based on:  Your signs and symptoms, especially if: ? You live in an area with a COVID-19 outbreak. ? You recently traveled to or from an area where the virus is common. ? You   provide care for or live with a person who was diagnosed with COVID-19. ? You were exposed to a person who was diagnosed with COVID-19.  A physical exam.  Lab tests, which may include: ? Taking a sample of fluid from the back of your nose and throat (nasopharyngeal fluid), your nose, or your throat using a swab. ? A sample of mucus from your lungs (sputum). ? Blood tests.  Imaging tests, which  may include, X-rays, CT scan, or ultrasound. How is this treated? At present, there is no medicine to treat COVID-19. Medicines that treat other diseases are being used on a trial basis to see if they are effective against COVID-19. Your health care provider will talk with you about ways to treat your symptoms. For most people, the infection is mild and can be managed at home with rest, fluids, and over-the-counter medicines. Treatment for a serious infection usually takes places in a hospital intensive care unit (ICU). It may include one or more of the following treatments. These treatments are given until your symptoms improve.  Receiving fluids and medicines through an IV.  Supplemental oxygen. Extra oxygen is given through a tube in the nose, a face mask, or a hood.  Positioning you to lie on your stomach (prone position). This makes it easier for oxygen to get into the lungs.  Continuous positive airway pressure (CPAP) or bi-level positive airway pressure (BPAP) machine. This treatment uses mild air pressure to keep the airways open. A tube that is connected to a motor delivers oxygen to the body.  Ventilator. This treatment moves air into and out of the lungs by using a tube that is placed in your windpipe.  Tracheostomy. This is a procedure to create a hole in the neck so that a breathing tube can be inserted.  Extracorporeal membrane oxygenation (ECMO). This procedure gives the lungs a chance to recover by taking over the functions of the heart and lungs. It supplies oxygen to the body and removes carbon dioxide. Follow these instructions at home: Lifestyle  If you are sick, stay home except to get medical care. Your health care provider will tell you how long to stay home. Call your health care provider before you go for medical care.  Rest at home as told by your health care provider.  Do not use any products that contain nicotine or tobacco, such as cigarettes, e-cigarettes, and  chewing tobacco. If you need help quitting, ask your health care provider.  Return to your normal activities as told by your health care provider. Ask your health care provider what activities are safe for you. General instructions  Take over-the-counter and prescription medicines only as told by your health care provider.  Drink enough fluid to keep your urine pale yellow.  Keep all follow-up visits as told by your health care provider. This is important. How is this prevented?  There is no vaccine to help prevent COVID-19 infection. However, there are steps you can take to protect yourself and others from this virus. To protect yourself:   Do not travel to areas where COVID-19 is a risk. The areas where COVID-19 is reported change often. To identify high-risk areas and travel restrictions, check the CDC travel website: wwwnc.cdc.gov/travel/notices  If you live in, or must travel to, an area where COVID-19 is a risk, take precautions to avoid infection. ? Stay away from people who are sick. ? Wash your hands often with soap and water for 20 seconds. If soap and water   are not available, use an alcohol-based hand sanitizer. ? Avoid touching your mouth, face, eyes, or nose. ? Avoid going out in public, follow guidance from your state and local health authorities. ? If you must go out in public, wear a cloth face covering or face mask. Make sure your mask covers your nose and mouth. ? Avoid crowded indoor spaces. Stay at least 6 feet (2 meters) away from others. ? Disinfect objects and surfaces that are frequently touched every day. This may include:  Counters and tables.  Doorknobs and light switches.  Sinks and faucets.  Electronics, such as phones, remote controls, keyboards, computers, and tablets. To protect others: If you have symptoms of COVID-19, take steps to prevent the virus from spreading to others.  If you think you have a COVID-19 infection, contact your health care  provider right away. Tell your health care team that you think you may have a COVID-19 infection.  Stay home. Leave your house only to seek medical care. Do not use public transport.  Do not travel while you are sick.  Wash your hands often with soap and water for 20 seconds. If soap and water are not available, use alcohol-based hand sanitizer.  Stay away from other members of your household. Let healthy household members care for children and pets, if possible. If you have to care for children or pets, wash your hands often and wear a mask. If possible, stay in your own room, separate from others. Use a different bathroom.  Make sure that all people in your household wash their hands well and often.  Cough or sneeze into a tissue or your sleeve or elbow. Do not cough or sneeze into your hand or into the air.  Wear a cloth face covering or face mask. Make sure your mask covers your nose and mouth. Where to find more information  Centers for Disease Control and Prevention: www.cdc.gov/coronavirus/2019-ncov/index.html  World Health Organization: www.who.int/health-topics/coronavirus Contact a health care provider if:  You live in or have traveled to an area where COVID-19 is a risk and you have symptoms of the infection.  You have had contact with someone who has COVID-19 and you have symptoms of the infection. Get help right away if:  You have trouble breathing.  You have pain or pressure in your chest.  You have confusion.  You have bluish lips and fingernails.  You have difficulty waking from sleep.  You have symptoms that get worse. These symptoms may represent a serious problem that is an emergency. Do not wait to see if the symptoms will go away. Get medical help right away. Call your local emergency services (911 in the [place] Do not drive yourself to the hospital. Let the emergency medical personnel know if you think you have COVID-19. Summary  COVID-19 is a  respiratory infection that is caused by a virus. It is also known as coronavirus disease or novel coronavirus. It can cause serious infections, such as pneumonia, acute respiratory distress syndrome, acute respiratory failure, or sepsis.  The virus that causes COVID-19 is contagious. This means that it can spread from person to person through droplets from breathing, speaking, singing, coughing, or sneezing.  You are more likely to develop a serious illness if you are 50 years of age or older, have a weak immune system, live in a nursing home, or have chronic disease.  There is no medicine to treat COVID-19. Your health care provider will talk with you about ways to treat your symptoms.    Take steps to protect yourself and others from infection. Wash your hands often and disinfect objects and surfaces that are frequently touched every day. Stay away from people who are sick and wear a mask if you are sick. This information is not intended to replace advice given to you by your health care provider. Make sure you discuss any questions you have with your health care provider. Document Revised: 10/19/2018 Document Reviewed: 01/25/2018 Elsevier Patient Education  2020 Elsevier Inc.  

## 2019-10-21 NOTE — ED Triage Notes (Signed)
Emergency Medicine Provider OB Triage Evaluation Note  Bailey [name] is a 24 y.o. female, G1P0, at [redacted] wks gestation who presents to the emergency department with complaints of cramping, spotting, green mucus discharge, and generalized body aches.  Patient states she started to feel poorly yesterday.  She reports all over body aches yesterday, cramping, and spotting that started yesterday.  She also had a headache.  She states she was standing when she got dizzy, causing her to fall.  Today she continues to feel poorly, she called her OB who recommended she come to the ER/mau for OB evaluation and monitoring.  Patient states she is followed by the [facility].  She has had 3 previous pregnancies, but she is not sure why she is high risk.  She is not vaccinated for Covid.  She reports that exposure 1 month ago, since then has had a negative test.  No recent testing or known exposure.  Review of  Systems  Positive: myalgias, cramping, spotting, discharge, HA Negative: fever, cough, CP, sob  Physical Exam  LMP 07/04/2019  General: Awake, no distress  HEENT: Atraumatic  Resp: Normal effort  Cardiac: Normal rate Abd: Nondistended, nontender  MSK: Moves all extremities without difficulty. Diffuse ttp of the back without focal/midline pain.  Neuro: Speech clear  Medical Decision Making  Pt evaluated for pregnancy concern and is stable for transfer to MAU. Will test for covid and call MAU. Pt is in agreement with plan for transfer.  12:23 PM Discussed with MAU APP, who accepts patient in transfer.  Clinical Impression  No diagnosis found.     Alveria Apley, PA-C 10/21/19 1230

## 2019-10-21 NOTE — MAU Provider Note (Signed)
History     CSN: [id number]  Arrival date and time: 10/21/19 1109   First Provider Initiated Contact with Patient 10/21/19 1506      Chief Complaint  Patient presents with   Loss of Consciousness   HPI This is a G4P3 who is approximately [redacted] weeks gestation, followed by [facility]. SHe had a fall and was told to come in to hospital for evaluation. She complains of weakness, body aches, fever. She went to main ED, diagnosed with COVID and sent here. She reports mucus like discharge and spotting. No bleeding. Denies cough, SOB, DOE, CP.    Past Medical History:  Diagnosis Date   Anxiety    Iron deficiency     History reviewed. No pertinent surgical history.  Family History  Problem Relation Age of Onset   Healthy Mother    Healthy Father     Social History   Tobacco Use   Smoking status: Never Smoker   Smokeless tobacco: Never Used  Substance Use Topics   Alcohol use: Yes    Comment: no drinks since August   Drug use: Yes    Types: Marijuana    Allergies:  Allergies  Allergen Reactions   Blackberry [Rubus Fruticosus] Anaphylaxis   Blueberry [Vaccinium Angustifolium] Anaphylaxis   Other Anaphylaxis    ALLERGIC TO ALL BERRIES   Raspberry Anaphylaxis   Strawberry Extract Anaphylaxis    Medications Prior to Admission  Medication Sig Dispense Refill Last Dose   cephALEXin (KEFLEX) 500 MG capsule Take 1 capsule (500 mg total) by mouth 2 (two) times daily. 10 capsule 0    metroNIDAZOLE (FLAGYL) 500 MG tablet Take 1 tablet (500 mg total) by mouth 2 (two) times daily. 14 tablet 0     Review of Systems Physical Exam   Blood pressure 107/67, pulse (!) 101, temperature (!) 100.7 F (38.2 C), temperature source Oral, resp. rate 18, last menstrual period 07/04/2019, SpO2 100 %.  Physical Exam Vitals reviewed.  Constitutional:      Appearance: Normal appearance.  Cardiovascular:     Rate and Rhythm: Normal rate.     Pulses: Normal pulses.   Pulmonary:     Effort: Pulmonary effort is normal.  Abdominal:     General: Abdomen is flat. There is no distension.     Palpations: Abdomen is soft. There is no mass.     Tenderness: There is no abdominal tenderness. There is no guarding or rebound.     Hernia: No hernia is present.  Skin:    General: Skin is warm and dry.     Capillary Refill: Capillary refill takes less than 2 seconds.  Neurological:     General: No focal deficit present.     Mental Status: She is alert.  Psychiatric:        Mood and Affect: Mood normal.        Behavior: Behavior normal.        Thought Content: Thought content normal.        Judgment: Judgment normal.    Results for orders placed or performed during the hospital encounter of 10/21/19 (from the past 24 hour(s))  Respiratory Panel by RT PCR (Flu A&B, Covid) - Nasopharyngeal Swab     Status: Abnormal   Collection Time: 10/21/19 12:21 PM   Specimen: Nasopharyngeal Swab  Result Value Ref Range   SARS Coronavirus 2 by RT PCR POSITIVE (A) NEGATIVE   Influenza A by PCR NEGATIVE NEGATIVE   Influenza B by PCR NEGATIVE  NEGATIVE      MAU Course  Procedures Will give Monoclonal Antibodies for COVID.  Wet prep for discharge. FHT 177.   MDM  Assessment and Plan     ICD-10-CM   1. COVID-19 affecting pregnancy in first trimester  O98.511    U07.1   2. [redacted] weeks gestation of pregnancy  Z3A.10    Received MAB for COVID - tolerated well.  Fioricet for HA. Discussed proning, vitamin C, Vitamin D. Quarantine for 14 days. Return precautions given: DOE, SOB, emesis.  Levie Heritage 10/21/2019, 3:23 PM

## 2019-12-11 ENCOUNTER — Encounter (HOSPITAL_COMMUNITY): Payer: Self-pay | Admitting: Obstetrics and Gynecology

## 2019-12-11 ENCOUNTER — Ambulatory Visit: Payer: Self-pay | Admitting: *Deleted

## 2019-12-11 ENCOUNTER — Inpatient Hospital Stay (HOSPITAL_COMMUNITY)
Admission: AD | Admit: 2019-12-11 | Discharge: 2019-12-11 | Disposition: A | Discharge status: Home / Self Care | Payer: Medicaid Other | Attending: Obstetrics and Gynecology | Admitting: Obstetrics and Gynecology

## 2019-12-11 ENCOUNTER — Inpatient Hospital Stay (HOSPITAL_BASED_OUTPATIENT_CLINIC_OR_DEPARTMENT_OTHER): Payer: Medicaid Other

## 2019-12-11 ENCOUNTER — Other Ambulatory Visit: Payer: Self-pay

## 2019-12-11 DIAGNOSIS — Z3A22 22 weeks gestation of pregnancy: Secondary | ICD-10-CM | POA: Diagnosis not present

## 2019-12-11 DIAGNOSIS — W010XXA Fall on same level from slipping, tripping and stumbling without subsequent striking against object, initial encounter: Secondary | ICD-10-CM | POA: Diagnosis not present

## 2019-12-11 DIAGNOSIS — O9A212 Injury, poisoning and certain other consequences of external causes complicating pregnancy, second trimester: Secondary | ICD-10-CM | POA: Insufficient documentation

## 2019-12-11 DIAGNOSIS — O219 Vomiting of pregnancy, unspecified: Secondary | ICD-10-CM

## 2019-12-11 DIAGNOSIS — O368120 Decreased fetal movements, second trimester, not applicable or unspecified: Secondary | ICD-10-CM | POA: Diagnosis not present

## 2019-12-11 DIAGNOSIS — W19XXXA Unspecified fall, initial encounter: Secondary | ICD-10-CM

## 2019-12-11 DIAGNOSIS — O212 Late vomiting of pregnancy: Secondary | ICD-10-CM | POA: Diagnosis not present

## 2019-12-11 DIAGNOSIS — S3092XA Unspecified superficial injury of abdominal wall, initial encounter: Secondary | ICD-10-CM

## 2019-12-11 LAB — URINALYSIS, ROUTINE W REFLEX MICROSCOPIC
Bilirubin Urine: NEGATIVE
Glucose, UA: NEGATIVE mg/dL
Hgb urine dipstick: NEGATIVE
Ketones, ur: NEGATIVE mg/dL
Leukocytes,Ua: NEGATIVE
Nitrite: NEGATIVE
Protein, ur: NEGATIVE mg/dL
Specific Gravity, Urine: 1.018 (ref 1.005–1.030)
pH: 8 (ref 5.0–8.0)

## 2019-12-11 MED ORDER — ONDANSETRON 4 MG PO TBDP: 4.0000 mg | ORAL_TABLET | Freq: Three times a day (TID) | ORAL | 0 refills | Status: DC | PRN

## 2019-12-11 MED ORDER — ONDANSETRON 4 MG PO TBDP
8.0000 mg | ORAL_TABLET | Freq: Once | ORAL | Status: AC
  Administered 2019-12-11: 8 mg via ORAL
  Filled 2019-12-11: qty 2

## 2019-12-11 NOTE — Telephone Encounter (Signed)
My dr is in [place], [place].  I don't feel well.  I've been vomiting.   He wants me admitted and monitored.   She has been transferred to 2 other departments before being transferred to the [facility].   I put pt on hold and called the main [facility]. She is to go to the Maternity Admissions Dept there for admission and IV fluids per her doctor from [facility] instructions.  I got back on the line with pt and let her know this.   I made sure she knew where to go and she did.  "I know exactly where that is".   "I'll go there".  No triage done so no protocol started.  Reason for Disposition . Health Information question, no triage required and triager able to answer question  Answer Assessment - Initial Assessment Questions 1. REASON FOR CALL or QUESTION: "What is your reason for calling today?" or "How can I best help you?" or "What question do you have that I can help answer?"     See progress note.  Protocols used: INFORMATION ONLY CALL - NO TRIAGE-A-AH

## 2019-12-11 NOTE — MAU Note (Signed)
Patient reports vomiting all morning and unable to keep anything down.  States the N/V has been ongoing but hasn't been put on any meds yet.  Patient states she fell this morning when she was going to throw up and was told to come in.  Says she has been having cramping for the past few days.  When she fell directly on her abdomen and hasn't felt any fetal movement since.  (FHR 150)

## 2019-12-11 NOTE — MAU Provider Note (Cosign Needed Addendum)
History     CSN: [id number]  Arrival date and time: 12/11/19 1304   Provider saw patient at 1520  Chief Complaint  Patient presents with   Emesis   Fall   Bailey [name] is a 24 yo G4P3003 who presents today for vomiting, a fall and DFM. This morning the patient tripped over her dog at home while walking to the bathroom to throw up. Patient states she fell flat on her face and directly on her stomach. Patient currently has a headache and her abdomen is sore over the area she fell on. Patient has had headaches for years for which she takes Tylenol, but it does not help. Patient continued to go to work this morning and continued vomiting. She called her provider's office who told her to be evaluated further. Patient has not been able to keep any food or liquids down. Patient is confused why she is having nausea and vomiting because she hasn't had any during the first trimester unlike her previous pregnancies. Patient also stated she has some lower back pain which started earlier in the week and has white creamy discharge that has been ongoing.   Pertinent Hx: Prenatal care provider is located in [place], patient has been trying to establish care here in [place] area. Patient is not taking any prenatal vitamins. Per patient, she has a history of anemia with her previous pregnancies.     OB History     Gravida  4   Para  3   Term  3   Preterm      AB      Living  3      SAB      TAB      Ectopic      Multiple      Live Births  3           Past Medical History:  Diagnosis Date   Anxiety    Iron deficiency     History reviewed. No pertinent surgical history.  Family History  Problem Relation Age of Onset   Healthy Mother    Healthy Father     Social History   Tobacco Use   Smoking status: Never Smoker   Smokeless tobacco: Never Used  Vaping Use   Vaping Use: Never used  Substance Use Topics   Alcohol use: Yes    Comment: no drinks since August    Drug use: Not Currently    Types: Marijuana    Allergies:  Allergies  Allergen Reactions   Blackberry [Rubus Fruticosus] Anaphylaxis   Blueberry [Vaccinium Angustifolium] Anaphylaxis   Other Anaphylaxis    ALLERGIC TO ALL BERRIES   Raspberry Anaphylaxis   Strawberry Extract Anaphylaxis    No medications prior to admission.    Review of Systems  Constitutional: Negative for chills and fever.  Eyes: Negative for visual disturbance.  Respiratory: Negative for chest tightness and shortness of breath.   Cardiovascular: Negative for chest pain, palpitations and leg swelling.  Gastrointestinal: Positive for abdominal pain, nausea and vomiting.  Genitourinary: Positive for vaginal discharge. Negative for pelvic pain and vaginal bleeding.  Musculoskeletal: Positive for back pain.  Neurological: Positive for headaches.  Psychiatric/Behavioral: The patient is nervous/anxious.    Physical Exam   Blood pressure 116/66, pulse 95, temperature 98.6 F (37 C), resp. rate 17, weight 113 kg, last menstrual period 07/04/2019.  Physical Exam Constitutional:      General: She is not in acute distress.    Appearance: She is  obese. She is not ill-appearing.  HENT:     Head: Atraumatic.  Cardiovascular:     Rate and Rhythm: Normal rate.     Pulses: Normal pulses.  Pulmonary:     Effort: Pulmonary effort is normal.  Abdominal:     General: There is no distension.     Palpations: Abdomen is soft. There is no mass.     Tenderness: There is abdominal tenderness. There is no guarding or rebound.     Hernia: No hernia is present.  Musculoskeletal:        General: Tenderness present.     Right lower leg: No edema.     Left lower leg: No edema.  Skin:    General: Skin is warm and dry.     Coloration: Skin is not pale.  Neurological:     General: No focal deficit present.     Mental Status: She is alert and oriented to person, place, and time.  Psychiatric:        Mood and Affect: Mood  normal.        Behavior: Behavior normal.     MAU Course  Procedures  Meds ordered this encounter  Medications   ondansetron (ZOFRAN-ODT) disintegrating tablet 8 mg   Orders Placed This Encounter  Procedures   [place] MFM OB LIMITED    Standing Status:   Standing    Number of Occurrences:   1    Order Specific Question:   What location should the exam be performed?    Answer:   [facility] ULTRASOUND    Order Specific Question:   Symptom/Reason for Exam    Answer:   Traumatic injury during pregnancy in second trimester [[id number]]    Order Specific Question:   Release to patient    Answer:   Immediate   Urinalysis, Routine w reflex microscopic Urine, Clean Catch    Standing Status:   Standing    Number of Occurrences:   1   Results for orders placed or performed during the hospital encounter of 12/11/19 (from the past 24 hour(s))  Urinalysis, Routine w reflex microscopic Urine, Clean Catch     Status: None   Collection Time: 12/11/19  2:13 PM  Result Value Ref Range   Color, Urine YELLOW YELLOW   APPearance CLEAR CLEAR   Specific Gravity, Urine 1.018 1.005 - 1.030   pH 8.0 5.0 - 8.0   Glucose, UA NEGATIVE NEGATIVE mg/dL   Hgb urine dipstick NEGATIVE NEGATIVE   Bilirubin Urine NEGATIVE NEGATIVE   Ketones, ur NEGATIVE NEGATIVE mg/dL   Protein, ur NEGATIVE NEGATIVE mg/dL   Nitrite NEGATIVE NEGATIVE   Leukocytes,Ua NEGATIVE NEGATIVE  No results found.  MDM Bailey [name] is a 24 yo G4P3003 who presents today for vomiting, a fall on her abdomen and DFM. Patient workup for traumatic injury to abdomen.   Assessment and Plan  1.) [redacted] weeks gestational age of pregnancy 2.) Traumatic injury during pregnancy (fall on abdomen) - Limited OB US ordered. Preliminary results discussed with patient. Patient will be informed when the final results are released. All patient questions were answered.  3.) Nausea and vomiting in pregnancy - Patient given 8 mg of Zofran and reports feeling better.    Patient ready to be discharged.   Colman Cater PA-S 12/11/2019, 5:23 PM   Attestation of Supervision of Student:  I confirm that I have verified the information documented in the physician assistant student's note and that I have also personally reperformed the  history, physical exam and all medical decision making activities.  I have verified that all services and findings are accurately documented in this student's note; and I agree with management and plan as outlined in the documentation. I have also made any necessary editorial changes. I was present for the entire history taking, assessment, exam and discharge planning.   Raelyn Mora, CNM [facility] [employer], [facility] 12/11/2019 8:31 PM

## 2019-12-11 NOTE — Discharge Instructions (Signed)
Return to MAU:  If you have heavy bleeding that soaks through more that 2 pads per hour for an hour or more  If you bleed so much that you feel like you might pass out or you do pass out  If you have significant abdominal pain that is not improved with Tylenol 1000 mg every 6 hours as needed for pain  If you develop a fever > 100.5   

## 2020-03-10 ENCOUNTER — Inpatient Hospital Stay (HOSPITAL_COMMUNITY)
Admission: AD | Admit: 2020-03-10 | Discharge: 2020-03-10 | Disposition: A | Discharge status: Home / Self Care | Payer: Medicaid Other | Attending: Obstetrics and Gynecology | Admitting: Obstetrics and Gynecology

## 2020-03-10 ENCOUNTER — Other Ambulatory Visit: Payer: Self-pay

## 2020-03-10 ENCOUNTER — Encounter (HOSPITAL_COMMUNITY): Payer: Self-pay | Admitting: Obstetrics and Gynecology

## 2020-03-10 DIAGNOSIS — O23593 Infection of other part of genital tract in pregnancy, third trimester: Secondary | ICD-10-CM | POA: Insufficient documentation

## 2020-03-10 DIAGNOSIS — Z79899 Other long term (current) drug therapy: Secondary | ICD-10-CM | POA: Diagnosis not present

## 2020-03-10 DIAGNOSIS — B9689 Other specified bacterial agents as the cause of diseases classified elsewhere: Secondary | ICD-10-CM | POA: Diagnosis not present

## 2020-03-10 DIAGNOSIS — O99891 Other specified diseases and conditions complicating pregnancy: Secondary | ICD-10-CM | POA: Diagnosis not present

## 2020-03-10 DIAGNOSIS — Z3689 Encounter for other specified antenatal screening: Secondary | ICD-10-CM

## 2020-03-10 DIAGNOSIS — N760 Acute vaginitis: Secondary | ICD-10-CM

## 2020-03-10 DIAGNOSIS — Z3A32 32 weeks gestation of pregnancy: Secondary | ICD-10-CM | POA: Diagnosis not present

## 2020-03-10 DIAGNOSIS — Z7982 Long term (current) use of aspirin: Secondary | ICD-10-CM | POA: Diagnosis not present

## 2020-03-10 DIAGNOSIS — Z0371 Encounter for suspected problem with amniotic cavity and membrane ruled out: Secondary | ICD-10-CM | POA: Diagnosis present

## 2020-03-10 HISTORY — DX: Headache, unspecified: R51.9

## 2020-03-10 LAB — WET PREP, GENITAL
Sperm: NONE SEEN
Trich, Wet Prep: NONE SEEN
Yeast Wet Prep HPF POC: NONE SEEN

## 2020-03-10 LAB — POCT FERN TEST
POCT Fern Test: NEGATIVE
POCT Fern Test: NEGATIVE

## 2020-03-10 MED ORDER — METRONIDAZOLE 500 MG PO TABS: 500.0000 mg | ORAL_TABLET | Freq: Two times a day (BID) | ORAL | 0 refills | Status: DC

## 2020-03-10 NOTE — MAU Note (Signed)
..  Bailey [name] is a 25 y.o. at [redacted]w[redacted]d (patient states her provider at [facility] gave her a due date of 05/05/2020 which would make her 32 weeks) here in MAU reporting: Yesterday around 5pm she noticed her underwear were wet before she had to use the restroom. This happened again this morning and put on a panty-liner and it also got wet with a clear fluid.  She has experienced some cramping. Denies bleeding. +FM. Pain score: 0/10  Vitals:   03/10/20 1924  BP: 123/70  Pulse: (!) 106  Resp: 16  Temp: 98.5 F (36.9 C)  SpO2: 100%     FHT:160 Lab orders placed from triage: UA

## 2020-03-10 NOTE — Discharge Instructions (Signed)
Bacterial Vaginosis  Bacterial vaginosis is an infection of the vagina. It happens when too many normal germs (healthy bacteria) grow in the vagina. This infection can make it easier to get other infections from sex (STIs). It is very important for pregnant women to get treated. This infection can cause babies to be born early or at a low birth weight. What are the causes? This infection is caused by an increase in certain germs that grow in the vagina. You cannot get this infection from toilet seats, bedsheets, swimming pools, or things that touch your vagina. What increases the risk?  Having sex with a new person or more than one person.  Having sex without protection.  Douching.  Having an intrauterine device (IUD).  Smoking.  Using drugs or drinking alcohol. These can lead you to do things that are risky.  Taking certain antibiotic medicines.  Being pregnant. What are the signs or symptoms? Some women have no symptoms. Symptoms may include:  A discharge from your vagina. It may be gray or white. It can be watery or foamy.  A fishy smell. This can happen after sex or during your menstrual period.  Itching in and around your vagina.  A feeling of burning or pain when you pee (urinate). How is this treated? This infection is treated with antibiotic medicines. These may be given to you as:  A pill.  A cream for your vagina.  A medicine that you put into your vagina (suppository). If the infection comes back after treatment, you may need more antibiotics. Follow these instructions at home: Medicines  Take over-the-counter and prescription medicines as told by your doctor.  Take or use your antibiotic medicine as told by your doctor. Do not stop taking or using it, even if you start to feel better. General instructions  If the person you have sex with is a woman, tell her that you have this infection. She will need to follow up with her doctor. If you have a female  partner, he does not need to be treated.  Do not have sex until you finish treatment.  Drink enough fluid to keep your pee pale yellow.  Keep your vagina and butt clean. ? Wash the area with warm water each day. ? Wipe from front to back after you use the toilet.  If you are breastfeeding a baby, ask your doctor if you should keep doing so during treatment.  Keep all follow-up visits. How is this prevented? Self-care  Do not douche.  Use only warm water to wash around your vagina.  Wear underwear that is cotton or lined with cotton.  Do not wear tight pants and pantyhose, especially in the summer. Safe sex  Use protection when you have sex. This includes: ? Use condoms. ? Use dental dams. This is a thin layer that protects the mouth during oral sex.  Limit how many people you have sex with. To prevent this infection, it is best to have sex with just one person.  Get tested for STIs. The person you have sex with should also get tested. Drugs and alcohol  Do not smoke or use any products that contain nicotine or tobacco. If you need help quitting, ask your doctor.  Do not use drugs.  Do not drink alcohol if: ? Your doctor tells you not to drink. ? You are pregnant, may be pregnant, or are planning to become pregnant.  If you drink alcohol: ? Limit how much you have to 0-1 drink   a day. ? Know how much alcohol is in your drink. In the [place] one drink equals one 12 oz bottle of beer (355 mL), one 5 oz glass of wine (148 mL), or one 1 oz glass of hard liquor (44 mL). Where to find more information  Centers for Disease Control and Prevention: www.cdc.gov  American Sexual Health Association: www.ashastd.org  Office on Women's Health: www.womenshealth.gov Contact a doctor if:  Your symptoms do not get better, even after you are treated.  You have more discharge or pain when you pee.  You have a fever or chills.  You have pain in your belly (abdomen) or in the area  between your hips.  You have pain with sex.  You bleed from your vagina between menstrual periods. Summary  This infection can happen when too many germs (bacteria) grow in the vagina.  This infection can make it easier to get infections from sex (STIs). Treating this can lower that chance.  Get treated if you are pregnant. This infection can cause babies to be born early.  Do not stop taking or using your antibiotic medicine, even if you start to feel better. This information is not intended to replace advice given to you by your health care provider. Make sure you discuss any questions you have with your health care provider. Document Revised: 06/20/2019 Document Reviewed: 06/20/2019 Elsevier Patient Education  2021 Elsevier Inc.  

## 2020-03-10 NOTE — MAU Provider Note (Signed)
History     CSN: [id number]  Arrival date and time: 03/10/20 1851   Event Date/Time   First Provider Initiated Contact with Patient 03/10/20 2017      Chief Complaint  Patient presents with  . Rupture of Membranes   25 y.o. [id number] @32 .0 wks presenting with leaking fluid. Reports onset last night. Has leaked clear fluid 5 times since. No gush of fluid. Had IC yesterday. Denies bleeding. Reports ctx q30 min. +FM.   OB History    Gravida  4   Para  3   Term  3   Preterm      AB      Living  3     SAB      IAB      Ectopic      Multiple      Live Births  3           Past Medical History:  Diagnosis Date  . Anxiety   . Headache   . Iron deficiency     Past Surgical History:  Procedure Laterality Date  . NO PAST SURGERIES      Family History  Problem Relation Age of Onset  . Healthy Mother   . Healthy Father     Social History   Tobacco Use  . Smoking status: Never Smoker  . Smokeless tobacco: Never Used  Vaping Use  . Vaping Use: Never used  Substance Use Topics  . Alcohol use: Yes    Comment: no drinks since August  . Drug use: Not Currently    Types: Marijuana    Allergies:  Allergies  Allergen Reactions  . Blackberry [Rubus Fruticosus] Anaphylaxis  . Blueberry [Vaccinium Angustifolium] Anaphylaxis  . Other Anaphylaxis    ALLERGIC TO ALL BERRIES  . Raspberry Anaphylaxis  . Strawberry Extract Anaphylaxis    Medications Prior to Admission  Medication Sig Dispense Refill Last Dose  . aspirin EC 81 MG tablet Take 81 mg by mouth daily. Swallow whole.   03/09/2020 at Unknown time  . escitalopram (LEXAPRO) 10 MG tablet Take 10 mg by mouth daily.   03/09/2020 at Unknown time  . magnesium oxide (MAG-OX) 400 MG tablet Take 400 mg by mouth daily.   03/10/2020 at Unknown time  . Prenatal Vit-Fe Fumarate-FA (PRENATAL MULTIVITAMIN) TABS tablet Take 1 tablet by mouth daily at 12 noon.   03/09/2020 at Unknown time  . SUMAtriptan (IMITREX) 25 MG  tablet Take 25 mg by mouth every 2 (two) hours as needed for migraine. May repeat in 2 hours if headache persists or recurs.   Past Week at Unknown time  . ondansetron (ZOFRAN ODT) 4 MG disintegrating tablet Take 1 tablet (4 mg total) by mouth every 8 (eight) hours as needed for nausea or vomiting. 15 tablet 0     Review of Systems  Gastrointestinal: Negative for abdominal pain.  Genitourinary: Positive for vaginal discharge. Negative for vaginal bleeding.   Physical Exam   Blood pressure 123/70, pulse (!) 106, temperature 98.5 F (36.9 C), temperature source Oral, resp. rate 16, height 5\' 5"  (1.651 m), weight 114.1 kg, last menstrual period 07/04/2019, SpO2 100 %.  Physical Exam Vitals and nursing note reviewed. Exam conducted with a chaperone present.  Constitutional:      Appearance: Normal appearance.  HENT:     Head: Normocephalic.  Pulmonary:     Effort: Pulmonary effort is normal. No respiratory distress.  Abdominal:     Palpations: Abdomen is soft.  Tenderness: There is no abdominal tenderness.  Genitourinary:    Comments: SSE: no pool, fern neg; thin yellow discharge SVE: closed/thick Musculoskeletal:        General: Normal range of motion.     Cervical back: Normal range of motion.  Skin:    General: Skin is warm and dry.  Neurological:     General: No focal deficit present.     Mental Status: She is alert and oriented to person, place, and time.  Psychiatric:        Mood and Affect: Mood normal.        Behavior: Behavior normal.   EFM: 140 bpm, mod variability, + accels, no decels Toco: none  Results for orders placed or performed during the hospital encounter of 03/10/20 (from the past 24 hour(s))  Wet prep, genital     Status: Abnormal   Collection Time: 03/10/20  8:12 PM  Result Value Ref Range   Yeast Wet Prep HPF POC NONE SEEN NONE SEEN   Trich, Wet Prep NONE SEEN NONE SEEN   Clue Cells Wet Prep HPF POC PRESENT (A) NONE SEEN   WBC, Wet Prep HPF POC  MANY (A) NONE SEEN   Sperm NONE SEEN   POCT fern test     Status: None   Collection Time: 03/10/20  8:50 PM  Result Value Ref Range   POCT Fern Test Negative = intact amniotic membranes   Fern Test     Status: Normal   Collection Time: 03/10/20  8:55 PM  Result Value Ref Range   POCT Fern Test Negative = intact amniotic membranes    MAU Course  Procedures  MDM Labs ordered and reviewed. No signs of SROM or PTL. Will treat BV. Stable for discharge home.  Assessment and Plan   1. [redacted] weeks gestation of pregnancy   2. NST (non-stress test) reactive   3. Bacterial vaginosis    Discharge home [facility] as scheduled Pelvic rest Rx Flagyl  Allergies as of 03/10/2020      Reactions   Blackberry [rubus Fruticosus] Anaphylaxis   Blueberry [vaccinium Angustifolium] Anaphylaxis   Other Anaphylaxis   ALLERGIC TO ALL BERRIES   Raspberry Anaphylaxis   Strawberry Extract Anaphylaxis      Medication List    TAKE these medications   aspirin EC 81 MG tablet Take 81 mg by mouth daily. Swallow whole.   escitalopram 10 MG tablet Commonly known as: LEXAPRO Take 10 mg by mouth daily.   magnesium oxide 400 MG tablet Commonly known as: MAG-OX Take 400 mg by mouth daily.   metroNIDAZOLE 500 MG tablet Commonly known as: FLAGYL Take 1 tablet (500 mg total) by mouth 2 (two) times daily.   ondansetron 4 MG disintegrating tablet Commonly known as: Zofran ODT Take 1 tablet (4 mg total) by mouth every 8 (eight) hours as needed for nausea or vomiting.   prenatal multivitamin Tabs tablet Take 1 tablet by mouth daily at 12 noon.   SUMAtriptan 25 MG tablet Commonly known as: IMITREX Take 25 mg by mouth every 2 (two) hours as needed for migraine. May repeat in 2 hours if headache persists or recurs.      Donette Larry, CNM 03/10/2020, 8:57 PM

## 2020-03-12 LAB — GC/CHLAMYDIA PROBE AMP (~~LOC~~) NOT AT ARMC
Chlamydia: NEGATIVE
Comment: NEGATIVE
Comment: NORMAL
Neisseria Gonorrhea: NEGATIVE

## 2020-04-04 ENCOUNTER — Other Ambulatory Visit: Payer: Self-pay

## 2020-04-04 ENCOUNTER — Encounter (HOSPITAL_COMMUNITY): Payer: Self-pay | Admitting: Obstetrics and Gynecology

## 2020-04-04 ENCOUNTER — Inpatient Hospital Stay (HOSPITAL_COMMUNITY)
Admission: AD | Admit: 2020-04-04 | Discharge: 2020-04-04 | Disposition: A | Discharge status: Home / Self Care | Payer: Medicaid Other | Attending: Obstetrics and Gynecology | Admitting: Obstetrics and Gynecology

## 2020-04-04 DIAGNOSIS — Z3689 Encounter for other specified antenatal screening: Secondary | ICD-10-CM

## 2020-04-04 DIAGNOSIS — K529 Noninfective gastroenteritis and colitis, unspecified: Secondary | ICD-10-CM | POA: Insufficient documentation

## 2020-04-04 DIAGNOSIS — O99613 Diseases of the digestive system complicating pregnancy, third trimester: Secondary | ICD-10-CM | POA: Diagnosis not present

## 2020-04-04 DIAGNOSIS — Z3A35 35 weeks gestation of pregnancy: Secondary | ICD-10-CM | POA: Insufficient documentation

## 2020-04-04 LAB — COMPREHENSIVE METABOLIC PANEL
ALT: 9 U/L (ref 0–44)
AST: 11 U/L — ABNORMAL LOW (ref 15–41)
Albumin: 2.4 g/dL — ABNORMAL LOW (ref 3.5–5.0)
Alkaline Phosphatase: 102 U/L (ref 38–126)
Anion gap: 12 (ref 5–15)
BUN: <5 mg/dL — ABNORMAL LOW (ref 6–20)
CO2: 18 mmol/L — ABNORMAL LOW (ref 22–32)
Calcium: 8.1 mg/dL — ABNORMAL LOW (ref 8.9–10.3)
Chloride: 106 mmol/L (ref 98–111)
Creatinine, Ser: 0.57 mg/dL (ref 0.44–1.00)
GFR, Estimated: >60 mL/min (ref >60)
Glucose, Bld: 79 mg/dL (ref 70–99)
Potassium: 3.6 mmol/L (ref 3.5–5.1)
Sodium: 136 mmol/L (ref 135–145)
Total Bilirubin: 0.4 mg/dL (ref 0.3–1.2)
Total Protein: 5.7 g/dL — ABNORMAL LOW (ref 6.5–8.1)

## 2020-04-04 LAB — CBC WITH DIFFERENTIAL/PLATELET
Abs Immature Granulocytes: 0.07 10*3/uL (ref 0.00–0.07)
Basophils Absolute: 0 10*3/uL (ref 0.0–0.1)
Basophils Relative: 0 %
Eosinophils Absolute: 0 10*3/uL (ref 0.0–0.5)
Eosinophils Relative: 0 %
HCT: 31.6 % — ABNORMAL LOW (ref 36.0–46.0)
Hemoglobin: 10.4 g/dL — ABNORMAL LOW (ref 12.0–15.0)
Immature Granulocytes: 1 %
Lymphocytes Relative: 7 %
Lymphs Abs: 0.6 10*3/uL — ABNORMAL LOW (ref 0.7–4.0)
MCH: 29.2 pg (ref 26.0–34.0)
MCHC: 32.9 g/dL (ref 30.0–36.0)
MCV: 88.8 fL (ref 80.0–100.0)
Monocytes Absolute: 0.4 10*3/uL (ref 0.1–1.0)
Monocytes Relative: 5 %
Neutro Abs: 7.4 10*3/uL (ref 1.7–7.7)
Neutrophils Relative %: 87 %
Platelets: 209 10*3/uL (ref 150–400)
RBC: 3.56 MIL/uL — ABNORMAL LOW (ref 3.87–5.11)
RDW: 12.9 % (ref 11.5–15.5)
WBC: 8.5 10*3/uL (ref 4.0–10.5)
nRBC: 0 % (ref 0.0–0.2)

## 2020-04-04 LAB — LIPASE, BLOOD: Lipase: 23 U/L (ref 11–51)

## 2020-04-04 MED ORDER — ALUM & MAG HYDROXIDE-SIMETH 200-200-20 MG/5ML PO SUSP
30.0000 mL | Freq: Once | ORAL | Status: AC
  Administered 2020-04-04: 30 mL via ORAL
  Filled 2020-04-04: qty 30

## 2020-04-04 MED ORDER — FAMOTIDINE IN NACL 20-0.9 MG/50ML-% IV SOLN
20.0000 mg | Freq: Once | INTRAVENOUS | Status: AC
  Administered 2020-04-04: 20 mg via INTRAVENOUS
  Filled 2020-04-04: qty 50

## 2020-04-04 MED ORDER — ONDANSETRON HCL 4 MG/2ML IJ SOLN
4.0000 mg | Freq: Once | INTRAMUSCULAR | Status: AC
  Administered 2020-04-04: 4 mg via INTRAVENOUS
  Filled 2020-04-04: qty 2

## 2020-04-04 MED ORDER — LACTATED RINGERS IV BOLUS
1000.0000 mL | Freq: Once | INTRAVENOUS | Status: AC
  Administered 2020-04-04: 1000 mL via INTRAVENOUS

## 2020-04-04 MED ORDER — DICYCLOMINE HCL 10 MG/5ML PO SOLN
10.0000 mg | Freq: Once | ORAL | Status: AC
  Administered 2020-04-04: 10 mg via ORAL
  Filled 2020-04-04: qty 5

## 2020-04-04 NOTE — MAU Note (Signed)
Pt is a G4P3 at 35.4 weeks arrived by EMS c/o N&V and diarrhea with dizziness.  Last time any GI symptoms was 8 am this morning.  She reports that the last time she was able to eat or drink anything was at over 24 hours ago.    She also reports occasional cramping.  Good fetal movement, no LOF or bleeding.

## 2020-04-04 NOTE — MAU Note (Signed)
Pt reports to mau via ems with c/o nausea, vomiting, and diarrhea since yesterday.  Pt also reporting some cramping.  Denies LOF. +FM

## 2020-04-04 NOTE — Discharge Instructions (Signed)
Food Poisoning  Food poisoning is an illness that is caused by eating or drinking contaminated foods or drinks. In most cases, food poisoning is mild and lasts 1-2 days. However, some cases can be serious, especially for people who have weak body defense systems (immune systems), older people, children and infants, and pregnant women.  What are the causes?  This condition is caused by contaminated food. Foods can become contaminated with viruses, bacteria, parasites, or mold due to:  · Poor personal hygiene, such as poor hand-washing practices.  · Storing food improperly, such as not refrigerating raw meat.  · Using unclean surfaces for preparing, serving, and storing food.  · Cooking or eating with unclean utensils.  If contaminated food is eaten, viruses, bacteria, or parasites can harm the intestine. This often causes severe diarrhea. The most common causes of food poisoning include:  · Viruses, such as:  ? Norovirus.  ? Rotavirus.  · Bacteria, such as:  ? Salmonella.  ? Listeria.  ? E. coli (Escherichia coli).  · Parasites, such as:  ? Giardia.  ? Toxoplasma gondii.  What are the signs or symptoms?  Symptoms may take several hours to appear after you consume contaminated food or drink. Symptoms include:  · Nausea.  · Vomiting.  · Cramping.  · Diarrhea.  · Fever and chills.  · Muscle aches.  · Dehydration. Dehydration can cause you to be tired and thirsty, have a dry mouth, and urinate less frequently.  How is this diagnosed?  Your health care provider can diagnose food poisoning with your medical history and a physical exam. This will include asking you what you have recently eaten. You may also have tests, including:  · Blood tests.  · Stool tests.  How is this treated?  Treatment focuses on relieving your symptoms and making sure that you are hydrated. You may also be given medicines. In severe cases, hospitalization may be required and you may need to receive fluids through an IV.  Follow these instructions  at home:  Eating and drinking    · Drink enough fluids to keep your urine pale yellow. You may need to drink small amounts of clear liquids frequently.  · Avoid milk, caffeine, and alcohol.  · Ask your health care provider for specific rehydration instructions.  · Eat small, frequent meals rather than large meals.  Medicines  · Take over-the-counter and prescription medicines only as told by your health care provider. Ask your health care provider if you should continue to take any of your regular prescribed and over-the-counter medicines.  · If you were prescribed an antibiotic medicine, take it as told by your health care provider. Do not stop taking the antibiotic even if you start to feel better.  General instructions    · Wash your hands thoroughly before you prepare food and after you go to the bathroom (use the toilet). Make sure that the people who live with you also wash their hands often.  · Rest at home until you feel better.  · Clean surfaces that you touch with a product that contains chlorine bleach.  · Keep all follow-up visits as told by your health care provider. This is important.  How is this prevented?  · Wash your hands, food preparation surfaces, and utensils thoroughly before and after you handle raw foods.  · Use separate food preparation surfaces and storage spaces for raw meat and for fruits and vegetables.  · Keep refrigerated foods colder than 40°F (5°C).  ·   Serve hot foods immediately or keep them heated above 140°F (60°C).  · Store dry foods in cool, dry spaces away from excess heat or moisture. Throw out any foods that do not smell right or are in cans that are bulging.  · Follow approved canning procedures.  · Heat canned foods thoroughly before you taste them.  · Drink bottled or sterile water when you travel.  Get help right away if:  · You have difficulty breathing, swallowing, talking, or moving.  · You develop blurred vision.  · You cannot eat or drink without vomiting.  · You  faint.  · Your eyes turn yellow.  · Your vomiting or diarrhea is persistent.  · Abdominal pain develops, increases, or localizes in one small area.  · You have a fever.  · You have blood or mucus in your stools, or your stools look dark black and tarry.  · You have signs of dehydration, such as:  ? Dark urine, very little urine, or no urine.  ? Cracked lips.  ? Not making tears while crying.  ? Dry mouth.  ? Sunken eyes.  ? Sleepiness.  ? Weakness.  ? Dizziness.  These symptoms may represent a serious problem that is an emergency. Do not wait to see if the symptoms will go away. Get medical help right away. Call your local emergency services (911 in the [place] Do not drive yourself to the hospital.  Summary  · Food poisoning is an illness that is caused by eating or drinking contaminated foods or drinks.  · Symptoms may include nausea, vomiting, diarrhea, muscle aches, cramping, fever, chills, and dehydration.  · In most cases, food poisoning is mild and lasts 1-2 days.  · In severe cases, hospitalization may be required.  This information is not intended to replace advice given to you by your health care provider. Make sure you discuss any questions you have with your health care provider.  Document Revised: 10/04/2017 Document Reviewed: 10/04/2017  Elsevier Patient Education © 2021 Elsevier Inc.

## 2020-04-30 NOTE — MAU Provider Note (Signed)
Chief Complaint:  Emesis, Nausea, Abdominal Pain, and Diarrhea   Event Date/Time   First Provider Initiated Contact with Patient 04/04/20 1143     HPI: Bailey [name] is a 25 y.o. [id number] at [redacted]w[redacted]d who presents to maternity admissions via EMS reporting nausea, vomiting and diarrhea x24hrs, but no GI symptoms since 8am today. She has been unable to keep anything down for over 24hrs and is now experiencing some dizziness upon standing and some occasional cramps. Denies vaginal bleeding, leaking of fluid, decreased fetal movement, fever, falls, or recent illness.   [facility]: [name]  Past Medical History:  Diagnosis Date  . Anxiety   . Headache   . Iron deficiency    OB History  Gravida Para Term Preterm AB Living  4 3 3     3   SAB IAB Ectopic Multiple Live Births          3    # Outcome Date GA Lbr Len/2nd Weight Sex Delivery Anes PTL Lv  4 Current           3 Term 2020     Vag-Spont   LIV  2 Term 2019     Vag-Spont   LIV  1 Term 2018     Vag-Spont   LIV   Past Surgical History:  Procedure Laterality Date  . NO PAST SURGERIES     Family History  Problem Relation Age of Onset  . Healthy Mother   . Healthy Father    Social History   Tobacco Use  . Smoking status: Never Smoker  . Smokeless tobacco: Never Used  Vaping Use  . Vaping Use: Never used  Substance Use Topics  . Alcohol use: Yes    Comment: no drinks since August  . Drug use: Not Currently    Types: Marijuana   Allergies  Allergen Reactions  . Blackberry [Rubus Fruticosus] Anaphylaxis  . Blueberry [Vaccinium Angustifolium] Anaphylaxis  . Other Anaphylaxis    ALLERGIC TO ALL BERRIES  . Raspberry Anaphylaxis  . Strawberry Extract Anaphylaxis   No medications prior to admission.   I have reviewed patient's Past Medical Hx, Surgical Hx, Family Hx, Social Hx, medications and allergies.   ROS:  Review of Systems  Constitutional: Negative for fatigue and fever.   HENT: Negative for congestion and sore throat.   Eyes: Negative for visual disturbance.  Respiratory: Negative for cough and shortness of breath.   Cardiovascular: Negative for chest pain.  Gastrointestinal: Positive for diarrhea, nausea and vomiting. Negative for abdominal pain (occasional cramps).  Genitourinary: Negative for vaginal bleeding and vaginal discharge.  Musculoskeletal: Negative for back pain.  Neurological: Positive for dizziness and light-headedness. Negative for syncope and headaches.   Physical Exam  BP 116/64   Pulse 81   Temp 99.1 F (37.3 C) (Oral)   Resp 17   LMP 07/04/2019   SpO2 100%   Constitutional: Well-developed, well-nourished female in mild distress.  Cardiovascular: normal rate & rhythm, no murmur Respiratory: normal effort, lung sounds clear throughout GI: Abd soft, non-tender, gravid appropriate for gestational age. Pos BS x 4 MS: Extremities nontender, no edema, normal ROM Neurologic: Alert and oriented x 4.  GU: no CVA tenderness Pelvic exam deferred  Fetal Tracing: reactive Baseline: 130 Variability: moderate Accelerations: 15x15 Decelerations: none Toco: relaxed   Labs: No results found for this or any previous visit (from the past 24 hour(s)).  Imaging:  CBC, CMP, Lipase all normal  MAU Course: Orders  Placed This Encounter  Procedures  . CBC with Differential/Platelet  . Comprehensive metabolic panel  . Lipase, blood  . Discharge patient   Meds ordered this encounter  Medications  . lactated ringers bolus 1,000 mL  . ondansetron (ZOFRAN) injection 4 mg  . famotidine (PEPCID) IVPB 20 mg premix  . alum & mag hydroxide-simeth (MAALOX/MYLANTA) 200-200-20 MG/5ML suspension 30 mL  . dicyclomine (BENTYL) 10 MG/5ML solution 10 mg   MDM: LR bolus with zofran and pepcid given with good relief of nausea, but pt stated "my guts still hurt from the diarrhea". No episodes of diarrhea while in MAU.  GI cocktail ordered and tolerated  well, any additional abdominal discomfort relieved.  Pt requested to transfer care to [facility], advised of process but emphasized we may not be able to get her in prior to her due date. Pt verbalized understanding.  Assessment: 1. Gastroenteritis   2. NST (non-stress test) reactive    Plan: Discharge home in stable condition with third trimester precautions.     Follow-up Information    [facility]. Call.   Specialty: Obstetrics and Gynecology Why: Request records be sent to the fax number, then call to get an appointment made if desiring to transfer care. Cannot guarantee we will have an opening prior to your induction/due date. Contact information: [address] [place] [address] [phone]       [facility]. Go to.   Why: as scheduled for ongoing prenatal care              Allergies as of 04/04/2020      Reactions   Blackberry [rubus Fruticosus] Anaphylaxis   Blueberry [vaccinium Angustifolium] Anaphylaxis   Other Anaphylaxis   ALLERGIC TO ALL BERRIES   Raspberry Anaphylaxis   Strawberry Extract Anaphylaxis      Medication List    TAKE these medications   aspirin EC 81 MG tablet Take 81 mg by mouth daily. Swallow whole.   escitalopram 10 MG tablet Commonly known as: LEXAPRO Take 10 mg by mouth daily.   magnesium oxide 400 MG tablet Commonly known as: MAG-OX Take 400 mg by mouth daily.   metroNIDAZOLE 500 MG tablet Commonly known as: FLAGYL Take 1 tablet (500 mg total) by mouth 2 (two) times daily.   ondansetron 4 MG disintegrating tablet Commonly known as: Zofran ODT Take 1 tablet (4 mg total) by mouth every 8 (eight) hours as needed for nausea or vomiting.   prenatal multivitamin Tabs tablet Take 1 tablet by mouth daily at 12 noon.   SUMAtriptan 25 MG tablet Commonly known as: IMITREX Take 25 mg by mouth every 2 (two) hours as needed for migraine. May repeat in 2 hours if headache  persists or recurs.       Edd Arbour, CNM, MSN, IBCLC Certified Nurse Midwife, [facility]

## 2020-12-30 ENCOUNTER — Telehealth: Payer: Self-pay

## 2020-12-30 NOTE — Telephone Encounter (Signed)
Pt calling about her health after having a baby.  [phone]  Adv pt I could not adv her b/c we haven't seen her.  At first thought she called the wrong number but she states her boss is a pt of our and sugg she call.  Adv we are not accepting new pts at the moment.  To try [facility] or [facility].

## 2021-05-15 ENCOUNTER — Inpatient Hospital Stay (HOSPITAL_COMMUNITY)
Admission: AD | Admit: 2021-05-15 | Discharge: 2021-05-15 | Disposition: A | Discharge status: Home / Self Care | Payer: Medicaid Other | Attending: Obstetrics & Gynecology | Admitting: Obstetrics & Gynecology

## 2021-05-15 ENCOUNTER — Encounter (HOSPITAL_COMMUNITY): Payer: Self-pay | Admitting: Obstetrics & Gynecology

## 2021-05-15 DIAGNOSIS — O99612 Diseases of the digestive system complicating pregnancy, second trimester: Secondary | ICD-10-CM | POA: Diagnosis not present

## 2021-05-15 DIAGNOSIS — K5900 Constipation, unspecified: Secondary | ICD-10-CM

## 2021-05-15 DIAGNOSIS — O26892 Other specified pregnancy related conditions, second trimester: Secondary | ICD-10-CM | POA: Insufficient documentation

## 2021-05-15 DIAGNOSIS — Z3A20 20 weeks gestation of pregnancy: Secondary | ICD-10-CM

## 2021-05-15 DIAGNOSIS — O99891 Other specified diseases and conditions complicating pregnancy: Secondary | ICD-10-CM | POA: Diagnosis not present

## 2021-05-15 LAB — CBC WITH DIFFERENTIAL/PLATELET
Abs Immature Granulocytes: 0.05 10*3/uL (ref 0.00–0.07)
Basophils Absolute: 0 10*3/uL (ref 0.0–0.1)
Basophils Relative: 0 %
Eosinophils Absolute: 0 10*3/uL (ref 0.0–0.5)
Eosinophils Relative: 0 %
HCT: 34.3 % — ABNORMAL LOW (ref 36.0–46.0)
Hemoglobin: 11.6 g/dL — ABNORMAL LOW (ref 12.0–15.0)
Immature Granulocytes: 1 %
Lymphocytes Relative: 19 %
Lymphs Abs: 1.5 10*3/uL (ref 0.7–4.0)
MCH: 30.7 pg (ref 26.0–34.0)
MCHC: 33.8 g/dL (ref 30.0–36.0)
MCV: 90.7 fL (ref 80.0–100.0)
Monocytes Absolute: 0.4 10*3/uL (ref 0.1–1.0)
Monocytes Relative: 5 %
Neutro Abs: 6 10*3/uL (ref 1.7–7.7)
Neutrophils Relative %: 75 %
Platelets: 247 10*3/uL (ref 150–400)
RBC: 3.78 MIL/uL — ABNORMAL LOW (ref 3.87–5.11)
RDW: 12.8 % (ref 11.5–15.5)
WBC: 8 10*3/uL (ref 4.0–10.5)
nRBC: 0 % (ref 0.0–0.2)

## 2021-05-15 LAB — COMPREHENSIVE METABOLIC PANEL
ALT: 12 U/L (ref 0–44)
AST: 15 U/L (ref 15–41)
Albumin: 2.9 g/dL — ABNORMAL LOW (ref 3.5–5.0)
Alkaline Phosphatase: 44 U/L (ref 38–126)
Anion gap: 3 — ABNORMAL LOW (ref 5–15)
BUN: 6 mg/dL (ref 6–20)
CO2: 25 mmol/L (ref 22–32)
Calcium: 8.6 mg/dL — ABNORMAL LOW (ref 8.9–10.3)
Chloride: 108 mmol/L (ref 98–111)
Creatinine, Ser: 0.63 mg/dL (ref 0.44–1.00)
GFR, Estimated: >60 mL/min (ref >60)
Glucose, Bld: 66 mg/dL — ABNORMAL LOW (ref 70–99)
Potassium: 3.6 mmol/L (ref 3.5–5.1)
Sodium: 136 mmol/L (ref 135–145)
Total Bilirubin: 0.3 mg/dL (ref 0.3–1.2)
Total Protein: 6.3 g/dL — ABNORMAL LOW (ref 6.5–8.1)

## 2021-05-15 LAB — URINALYSIS, ROUTINE W REFLEX MICROSCOPIC
Bilirubin Urine: NEGATIVE
Glucose, UA: NEGATIVE mg/dL
Hgb urine dipstick: NEGATIVE
Ketones, ur: NEGATIVE mg/dL
Leukocytes,Ua: NEGATIVE
Nitrite: NEGATIVE
Protein, ur: NEGATIVE mg/dL
Specific Gravity, Urine: 1.012 (ref 1.005–1.030)
pH: 6 (ref 5.0–8.0)

## 2021-05-15 MED ORDER — DOCUSATE SODIUM 250 MG PO CAPS: 250.0000 mg | ORAL_CAPSULE | Freq: Two times a day (BID) | ORAL | 0 refills | Status: AC

## 2021-05-15 MED ORDER — ACETAMINOPHEN 500 MG PO TABS
1000.0000 mg | ORAL_TABLET | Freq: Once | ORAL | Status: AC
  Administered 2021-05-15: 1000 mg via ORAL
  Filled 2021-05-15: qty 2

## 2021-05-15 MED ORDER — FAMOTIDINE 20 MG PO TABS: 20.0000 mg | ORAL_TABLET | Freq: Two times a day (BID) | ORAL | 0 refills | Status: AC

## 2021-05-15 NOTE — MAU Provider Note (Signed)
?History  ?  ? ?CSN: [id number] ? ?Arrival date and time: 05/15/21 1428 ? ? Event Date/Time  ? First Provider Initiated Contact with Patient 05/15/21 1513   ?  ? ?Chief Complaint  ?Patient presents with  ? Back Pain  ? Abdominal Pain  ? Loss of Consciousness  ? Headache  ? ?HPI ?Astra A Degroff is a 26 y.o. [id number] at [redacted]w[redacted]d who presents with back pain. She states this back pain has been ongoing for several weeks. She reports she told her OB at [facility] and they did not do anything about it. She rates the pain a 10/10 and has tried tylenol with no relief. She reports significant constipation for the last 2 weeks. She reports when she goes, she only goes small amounts and it is hard to go. She denies any dysuria. She also reports she has been feeling dizzy and lightheaded at work. She reports she had a near syncope episode where she had to lay on the floor but was immediately able to return to work. She reports she thinks she hit her head but no one witnessed it and reports it could be "just her migraines." She denies any bruising, bleeding or tenderness of her head. She denies any vaginal bleeding or discharge. She reports feeling fetal movement.  ? ?She reports she is currently taking medication for a UTI. She denies any fever or chills at home. Denies any nausea or vomiting. She reports she doesn't eat much because of her heartburn. ? ?OB History   ? ? Gravida  ?8  ? Para  ?4  ? Term  ?4  ? Preterm  ?   ? AB  ?3  ? Living  ?4  ?  ? ? SAB  ?3  ? IAB  ?   ? Ectopic  ?   ? Multiple  ?   ? Live Births  ?4  ?   ?  ?  ? ? ?Past Medical History:  ?Diagnosis Date  ? Anxiety   ? Headache   ? Iron deficiency   ? ? ?Past Surgical History:  ?Procedure Laterality Date  ? NO PAST SURGERIES    ? ? ?Family History  ?Problem Relation Age of Onset  ? Healthy Mother   ? Healthy Father   ? ? ?Social History  ? ?Tobacco Use  ? Smoking status: Never  ? Smokeless tobacco: Never  ?Vaping Use  ? Vaping Use: Never used  ?Substance Use  Topics  ? Alcohol use: Yes  ?  Comment: no drinks since August  ? Drug use: Not Currently  ?  Types: Marijuana  ? ? ?Allergies:  ?Allergies  ?Allergen Reactions  ? Blackberry [Rubus Fruticosus] Anaphylaxis  ? Blueberry [Vaccinium Angustifolium] Anaphylaxis  ? Other Anaphylaxis  ?  ALLERGIC TO ALL BERRIES  ? Raspberry Anaphylaxis  ? Strawberry Extract Anaphylaxis  ? ? ?Medications Prior to Admission  ?Medication Sig Dispense Refill Last Dose  ? aspirin EC 81 MG tablet Take 81 mg by mouth daily. Swallow whole.     ? escitalopram (LEXAPRO) 10 MG tablet Take 10 mg by mouth daily.     ? magnesium oxide (MAG-OX) 400 MG tablet Take 400 mg by mouth daily.     ? metroNIDAZOLE (FLAGYL) 500 MG tablet Take 1 tablet (500 mg total) by mouth 2 (two) times daily. 14 tablet 0   ? ondansetron (ZOFRAN ODT) 4 MG disintegrating tablet Take 1 tablet (4 mg total) by mouth every 8 (eight) hours as needed  for nausea or vomiting. 15 tablet 0   ? Prenatal Vit-Fe Fumarate-FA (PRENATAL MULTIVITAMIN) TABS tablet Take 1 tablet by mouth daily at 12 noon.     ? SUMAtriptan (IMITREX) 25 MG tablet Take 25 mg by mouth every 2 (two) hours as needed for migraine. May repeat in 2 hours if headache persists or recurs.     ? ? ?Review of Systems  ?Constitutional: Negative.  Negative for fatigue and fever.  ?HENT: Negative.    ?Respiratory: Negative.  Negative for shortness of breath.   ?Cardiovascular: Negative.  Negative for chest pain.  ?Gastrointestinal:  Positive for constipation. Negative for abdominal pain, diarrhea, nausea and vomiting.  ?Genitourinary: Negative.  Negative for dysuria, vaginal bleeding and vaginal discharge.  ?Musculoskeletal:  Positive for back pain.  ?Neurological:  Positive for headaches. Negative for dizziness.  ?Physical Exam  ? ?Blood pressure 125/62, pulse 92, temperature 98.7 ?F (37.1 ?C), resp. rate 18, height 5\' 5"  (1.651 m), weight 107.8 kg, SpO2 99 %, unknown if currently breastfeeding. ? ?Physical Exam ?Vitals and  nursing note reviewed.  ?Constitutional:   ?   General: She is not in acute distress. ?   Appearance: She is well-developed.  ?HENT:  ?   Head: Normocephalic.  ?Eyes:  ?   Pupils: Pupils are equal, round, and reactive to light.  ?Cardiovascular:  ?   Rate and Rhythm: Normal rate and regular rhythm.  ?   Heart sounds: Normal heart sounds.  ?Pulmonary:  ?   Effort: Pulmonary effort is normal. No respiratory distress.  ?   Breath sounds: Normal breath sounds.  ?Abdominal:  ?   General: Bowel sounds are normal. There is no distension.  ?   Palpations: Abdomen is soft.  ?   Tenderness: There is no abdominal tenderness.  ?Skin: ?   General: Skin is warm and dry.  ?Neurological:  ?   Mental Status: She is alert and oriented to person, place, and time.  ?Psychiatric:     ?   Mood and Affect: Mood normal.     ?   Behavior: Behavior normal.     ?   Thought Content: Thought content normal.     ?   Judgment: Judgment normal.  ? ?FHT: 152 bpm ? ? Cervix: Closed/thick/posterior ? ?MAU Course  ?Procedures ?Results for orders placed or performed during the hospital encounter of 05/15/21 (from the past 24 hour(s))  ?CBC with Differential/Platelet     Status: Abnormal  ? Collection Time: 05/15/21  3:29 PM  ?Result Value Ref Range  ? WBC 8.0 4.0 - 10.5 K/uL  ? RBC 3.78 (L) 3.87 - 5.11 MIL/uL  ? Hemoglobin 11.6 (L) 12.0 - 15.0 g/dL  ? HCT 34.3 (L) 36.0 - 46.0 %  ? MCV 90.7 80.0 - 100.0 fL  ? MCH 30.7 26.0 - 34.0 pg  ? MCHC 33.8 30.0 - 36.0 g/dL  ? RDW 12.8 11.5 - 15.5 %  ? Platelets 247 150 - 400 K/uL  ? nRBC 0.0 0.0 - 0.2 %  ? Neutrophils Relative % 75 %  ? Neutro Abs 6.0 1.7 - 7.7 K/uL  ? Lymphocytes Relative 19 %  ? Lymphs Abs 1.5 0.7 - 4.0 K/uL  ? Monocytes Relative 5 %  ? Monocytes Absolute 0.4 0.1 - 1.0 K/uL  ? Eosinophils Relative 0 %  ? Eosinophils Absolute 0.0 0.0 - 0.5 K/uL  ? Basophils Relative 0 %  ? Basophils Absolute 0.0 0.0 - 0.1 K/uL  ? Immature Granulocytes 1 %  ?  Abs Immature Granulocytes 0.05 0.00 - 0.07 K/uL   ?Comprehensive metabolic panel     Status: Abnormal  ? Collection Time: 05/15/21  3:29 PM  ?Result Value Ref Range  ? Sodium 136 135 - 145 mmol/L  ? Potassium 3.6 3.5 - 5.1 mmol/L  ? Chloride 108 98 - 111 mmol/L  ? CO2 25 22 - 32 mmol/L  ? Glucose, Bld 66 (L) 70 - 99 mg/dL  ? BUN 6 6 - 20 mg/dL  ? Creatinine, Ser 0.63 0.44 - 1.00 mg/dL  ? Calcium 8.6 (L) 8.9 - 10.3 mg/dL  ? Total Protein 6.3 (L) 6.5 - 8.1 g/dL  ? Albumin 2.9 (L) 3.5 - 5.0 g/dL  ? AST 15 15 - 41 U/L  ? ALT 12 0 - 44 U/L  ? Alkaline Phosphatase 44 38 - 126 U/L  ? Total Bilirubin 0.3 0.3 - 1.2 mg/dL  ? GFR, Estimated >60 >60 mL/min  ? Anion gap 3 (L) 5 - 15  ?Urinalysis, Routine w reflex microscopic Urine, Clean Catch     Status: Abnormal  ? Collection Time: 05/15/21  5:37 PM  ?Result Value Ref Range  ? Color, Urine STRAW (A) YELLOW  ? APPearance CLEAR CLEAR  ? Specific Gravity, Urine 1.012 1.005 - 1.030  ? pH 6.0 5.0 - 8.0  ? Glucose, UA NEGATIVE NEGATIVE mg/dL  ? Hgb urine dipstick NEGATIVE NEGATIVE  ? Bilirubin Urine NEGATIVE NEGATIVE  ? Ketones, ur NEGATIVE NEGATIVE mg/dL  ? Protein, ur NEGATIVE NEGATIVE mg/dL  ? Nitrite NEGATIVE NEGATIVE  ? Leukocytes,Ua NEGATIVE NEGATIVE  ?  ?MDM ?UA ?CBC with Diff ?CMP ?Soap Suds enema- patient reports significant results and is feeling much better. ? ?Assessment and Plan  ? ?1. Constipation during pregnancy in second trimester   ?2. [redacted] weeks gestation of pregnancy   ? ?-Discharge home in stable condition ?-Rx for colace and pepcid sent to patient's pharmacy ?-Second trimester precautions discussed ?-Patient advised to follow-up with OB as scheduled on Monday for prenatal care ?-Patient may return to MAU as needed or if her condition were to change or worsen ? ? ?Rolm Bookbinder CNM ?05/15/2021, 3:14 PM  ?

## 2021-05-15 NOTE — MAU Note (Addendum)
.  Bailey [name] is a 26 y.o. at [redacted]w[redacted]d here in MAU reporting: sharp lower back pain that started on Tuesday. States she has not taken any medication. Also endorses lower abdominal cramping. Denies VB or LOF. Also states she also passed out earlier today and hit her head, endorses a headache but has had them frequently with this pregnancy. ? ?Pain score: 10 ? ?FHT:152 ?Lab orders placed from triage:  UA ?

## 2021-05-15 NOTE — Discharge Instructions (Signed)
Prenatal Care Providers           [facility]  [address] [phone]  [facility]   [address]  [phone]  [facility] [place]       [address] [phone]            [facility] [place]     [address] [phone]          [facility] [place]   [address] [phone]  [facility]  [address] [phone]     [facility]  [address]   [phone]     [facility]  Phone: [phone]  [facility]  Phone: [phone]  [facility] Phone: [phone]  [facility] Phone: [phone]  [facility] Phone: [phone]  [facility] Phone: [phone]  [facility]  Phone: [phone] Safe Medications in Pregnancy   Acne: Benzoyl Peroxide Salicylic Acid  Backache/Headache: Tylenol: 2 regular strength every 4 hours OR              2 Extra strength every 6 hours  Colds/Coughs/Allergies: Benadryl (alcohol free) 25 mg every 6 hours as needed Breath right strips Claritin Cepacol throat lozenges Chloraseptic throat spray Cold-Eeze- up to three times per day Cough drops, alcohol free Flonase (by prescription only) Guaifenesin Mucinex Robitussin DM (plain only, alcohol free) Saline nasal spray/drops Sudafed (pseudoephedrine) & Actifed ** use only after [redacted] weeks gestation and if you do not have high blood pressure Tylenol Vicks Vaporub Zinc lozenges Zyrtec   Constipation: Colace Ducolax suppositories Fleet enema Glycerin suppositories Metamucil Milk of magnesia Miralax Senokot Smooth move tea  Diarrhea: Kaopectate Imodium A-D  *NO pepto  Bismol  Hemorrhoids: Anusol Anusol HC Preparation H Tucks  Indigestion: Tums Maalox Mylanta Zantac  Pepcid  Insomnia: Benadryl (alcohol free) 25mg every 6 hours as needed Tylenol PM Unisom, no Gelcaps  Leg Cramps: Tums MagGel  Nausea/Vomiting:  Bonine Dramamine Emetrol Ginger extract Sea bands Meclizine  Nausea medication to take during pregnancy:  Unisom (doxylamine succinate 25 mg tablets) Take one tablet daily at bedtime. If symptoms are not adequately controlled, the dose can be increased to a maximum recommended dose of two tablets daily (1/2 tablet in the morning, 1/2 tablet mid-afternoon and one at bedtime). Vitamin B6 100mg tablets. Take one tablet twice a day (up to 200 mg per day).  Skin Rashes: Aveeno products Benadryl cream or 25mg every 6 hours as needed Calamine Lotion 1% cortisone cream  Yeast infection: Gyne-lotrimin 7 Monistat 7   **If taking multiple medications, please check labels to avoid duplicating the same active ingredients **take medication as directed on the label ** Do not exceed 4000 mg of tylenol in 24 hours **Do not take medications that contain aspirin or ibuprofen    
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
## 2021-12-28 ENCOUNTER — Emergency Department (HOSPITAL_COMMUNITY): Payer: Medicaid Other

## 2021-12-28 ENCOUNTER — Emergency Department (HOSPITAL_COMMUNITY)
Admission: EM | Admit: 2021-12-28 | Discharge: 2021-12-28 | Disposition: A | Discharge status: Home / Self Care | Payer: Medicaid Other | Attending: Emergency Medicine | Admitting: Emergency Medicine

## 2021-12-28 ENCOUNTER — Encounter (HOSPITAL_COMMUNITY): Payer: Self-pay | Admitting: Emergency Medicine

## 2021-12-28 DIAGNOSIS — R1013 Epigastric pain: Secondary | ICD-10-CM | POA: Insufficient documentation

## 2021-12-28 DIAGNOSIS — K529 Noninfective gastroenteritis and colitis, unspecified: Secondary | ICD-10-CM

## 2021-12-28 DIAGNOSIS — Z7982 Long term (current) use of aspirin: Secondary | ICD-10-CM | POA: Insufficient documentation

## 2021-12-28 DIAGNOSIS — R109 Unspecified abdominal pain: Secondary | ICD-10-CM

## 2021-12-28 LAB — CBC WITH DIFFERENTIAL/PLATELET
Abs Immature Granulocytes: 0.03 10*3/uL (ref 0.00–0.07)
Basophils Absolute: 0 10*3/uL (ref 0.0–0.1)
Basophils Relative: 0 %
Eosinophils Absolute: 0.1 10*3/uL (ref 0.0–0.5)
Eosinophils Relative: 1 %
HCT: 40.7 % (ref 36.0–46.0)
Hemoglobin: 12.7 g/dL (ref 12.0–15.0)
Immature Granulocytes: 0 %
Lymphocytes Relative: 16 %
Lymphs Abs: 1.5 10*3/uL (ref 0.7–4.0)
MCH: 28.5 pg (ref 26.0–34.0)
MCHC: 31.2 g/dL (ref 30.0–36.0)
MCV: 91.5 fL (ref 80.0–100.0)
Monocytes Absolute: 0.5 10*3/uL (ref 0.1–1.0)
Monocytes Relative: 6 %
Neutro Abs: 7.5 10*3/uL (ref 1.7–7.7)
Neutrophils Relative %: 77 %
Platelets: 281 10*3/uL (ref 150–400)
RBC: 4.45 MIL/uL (ref 3.87–5.11)
RDW: 12.8 % (ref 11.5–15.5)
WBC: 9.7 10*3/uL (ref 4.0–10.5)
nRBC: 0 % (ref 0.0–0.2)

## 2021-12-28 LAB — COMPREHENSIVE METABOLIC PANEL
ALT: 18 U/L (ref 0–44)
AST: 16 U/L (ref 15–41)
Albumin: 3.5 g/dL (ref 3.5–5.0)
Alkaline Phosphatase: 81 U/L (ref 38–126)
Anion gap: 6 (ref 5–15)
BUN: 11 mg/dL (ref 6–20)
CO2: 24 mmol/L (ref 22–32)
Calcium: 8.8 mg/dL — ABNORMAL LOW (ref 8.9–10.3)
Chloride: 107 mmol/L (ref 98–111)
Creatinine, Ser: 0.64 mg/dL (ref 0.44–1.00)
GFR, Estimated: >60 mL/min (ref >60)
Glucose, Bld: 93 mg/dL (ref 70–99)
Potassium: 4 mmol/L (ref 3.5–5.1)
Sodium: 137 mmol/L (ref 135–145)
Total Bilirubin: 0.4 mg/dL (ref 0.3–1.2)
Total Protein: 7.3 g/dL (ref 6.5–8.1)

## 2021-12-28 LAB — URINALYSIS, ROUTINE W REFLEX MICROSCOPIC
Bacteria, UA: NONE SEEN
Bilirubin Urine: NEGATIVE
Glucose, UA: NEGATIVE mg/dL
Ketones, ur: NEGATIVE mg/dL
Leukocytes,Ua: NEGATIVE
Nitrite: NEGATIVE
Protein, ur: NEGATIVE mg/dL
Specific Gravity, Urine: 1.021 (ref 1.005–1.030)
pH: 5 (ref 5.0–8.0)

## 2021-12-28 LAB — I-STAT BETA HCG BLOOD, ED (MC, WL, AP ONLY): I-stat hCG, quantitative: <5 m[IU]/mL (ref <5)

## 2021-12-28 LAB — LIPASE, BLOOD: Lipase: 28 U/L (ref 11–51)

## 2021-12-28 LAB — POC OCCULT BLOOD, ED: Fecal Occult Bld: NEGATIVE

## 2021-12-28 MED ORDER — ONDANSETRON HCL 4 MG/2ML IJ SOLN
4.0000 mg | Freq: Once | INTRAMUSCULAR | Status: AC
  Administered 2021-12-28: 4 mg via INTRAVENOUS
  Filled 2021-12-28: qty 2

## 2021-12-28 MED ORDER — ALUM & MAG HYDROXIDE-SIMETH 200-200-20 MG/5ML PO SUSP
30.0000 mL | Freq: Once | ORAL | Status: AC
  Administered 2021-12-28: 30 mL via ORAL
  Filled 2021-12-28: qty 30

## 2021-12-28 MED ORDER — IOHEXOL 350 MG/ML SOLN
75.0000 mL | Freq: Once | INTRAVENOUS | Status: AC | PRN
  Administered 2021-12-28: 75 mL via INTRAVENOUS

## 2021-12-28 MED ORDER — DICYCLOMINE HCL 20 MG PO TABS: 20.0000 mg | ORAL_TABLET | Freq: Two times a day (BID) | ORAL | 0 refills | Status: AC

## 2021-12-28 MED ORDER — FAMOTIDINE IN NACL 20-0.9 MG/50ML-% IV SOLN
20.0000 mg | Freq: Once | INTRAVENOUS | Status: AC
  Administered 2021-12-28: 20 mg via INTRAVENOUS
  Filled 2021-12-28: qty 50

## 2021-12-28 MED ORDER — FENTANYL CITRATE PF 50 MCG/ML IJ SOSY
50.0000 ug | PREFILLED_SYRINGE | Freq: Once | INTRAMUSCULAR | Status: AC
  Administered 2021-12-28: 50 ug via INTRAVENOUS
  Filled 2021-12-28: qty 1

## 2021-12-28 MED ORDER — ACETAMINOPHEN 500 MG PO TABS: 1000.0000 mg | ORAL_TABLET | Freq: Once | ORAL | Status: DC

## 2021-12-28 MED ORDER — SUCRALFATE 1 G PO TABS: 1.0000 g | ORAL_TABLET | Freq: Three times a day (TID) | ORAL | 0 refills | Status: AC

## 2021-12-28 MED ORDER — SUCRALFATE 1 G PO TABS: 1.0000 g | ORAL_TABLET | Freq: Three times a day (TID) | ORAL | 0 refills | Status: DC

## 2021-12-28 MED ORDER — SODIUM CHLORIDE 0.9 % IV BOLUS
1000.0000 mL | Freq: Once | INTRAVENOUS | Status: AC
  Administered 2021-12-28: 1000 mL via INTRAVENOUS

## 2021-12-28 MED ORDER — OXYCODONE HCL 5 MG PO TABS
5.0000 mg | ORAL_TABLET | Freq: Once | ORAL | Status: AC
  Administered 2021-12-28: 5 mg via ORAL
  Filled 2021-12-28: qty 1

## 2021-12-28 NOTE — Discharge Instructions (Addendum)
PLEASE PUMP AND DUMP FOR THE NEXT 7 DAYS. THE MEDICATIONS YOU ARE TAKING MAY HARM YOUR BABY IF YOU RESUME BREAST FEEDING WHILE ON THE PRESCRIBED MEDICATION   Your abdominal discomfort is likely due to a viral infection causing irritation and inflammation to your intestines. Please follow a bland diet over the next few days, focus on rehydration. Please follow up with primary care in the office. Please wash your hands thoroughly after using the bathroom.   It was a pleasure caring for you today in the emergency department.  Please return to the emergency department for any worsening or worrisome symptoms.

## 2021-12-28 NOTE — ED Notes (Signed)
Patient transported to Ultrasound 

## 2021-12-28 NOTE — ED Triage Notes (Signed)
Pt arrives via [facility] with abd since Friday. Pt reports she is vomiting blood. Endorses 10/10 sharp and shooting abd pain.

## 2021-12-28 NOTE — ED Provider Notes (Addendum)
[facility] EMERGENCY DEPARTMENT Provider Note   CSN: [id number] Arrival date & time: 12/28/21  7106     History  Chief Complaint  Patient presents with   Abdominal Pain    Bailey [name] is a 26 y.o. female who presents emergency department with concerns for epigastric abdominal pain onset 5 days and worsening this morning. Notes that the pain is sharp and shooting.  Notes that she has had blood mixed in with her stool x 2 episodes.  Has a history of IBS and typically has blood mixed in with her stool with her IBS episodes.  Has been evaluated by [facility] for this before.  Has nausea and emesis.  Denies diarrhea, chest pain, shortness of breath.  No past medical history of GERD.  Patient is 3 months postpartum.  Patient notes that her cycle just ended on 4 days ago.   The history is provided by the patient. No language interpreter was used.       Home Medications Prior to Admission medications   Medication Sig Start Date End Date Taking? Authorizing Provider  aspirin EC 81 MG tablet Take 81 mg by mouth daily. Swallow whole.    [provider]  famotidine (PEPCID) 20 MG tablet Take 1 tablet (20 mg total) by mouth 2 (two) times daily. 05/15/21   Rolm Bookbinder, CNM  magnesium oxide (MAG-OX) 400 MG tablet Take 400 mg by mouth daily.    [provider]  Prenatal Vit-Fe Fumarate-FA (PRENATAL MULTIVITAMIN) TABS tablet Take 1 tablet by mouth daily at 12 noon.    [provider]      Allergies    Blackberry [rubus fruticosus], Blueberry [vaccinium angustifolium], Other, and Raspberry    Review of Systems   Review of Systems  Gastrointestinal:  Positive for abdominal pain.  All other systems reviewed and are negative.   Physical Exam Updated Vital Signs BP 118/78 (BP Location: Right Arm)   Pulse 70   Temp 98.5 F (36.9 C) (Oral)   Resp 20   Ht 5\' 5"  (1.651 m)   Wt 107 kg   SpO2 100%   BMI 39.25 kg/m  Physical Exam Vitals and  nursing note reviewed. Exam conducted with a chaperone present.  Constitutional:      General: She is not in acute distress.    Appearance: She is not diaphoretic.  HENT:     Head: Normocephalic and atraumatic.     Mouth/Throat:     Pharynx: No oropharyngeal exudate.  Eyes:     General: No scleral icterus.    Conjunctiva/sclera: Conjunctivae normal.  Cardiovascular:     Rate and Rhythm: Normal rate and regular rhythm.     Pulses: Normal pulses.     Heart sounds: Normal heart sounds.  Pulmonary:     Effort: Pulmonary effort is normal. No respiratory distress.     Breath sounds: Normal breath sounds. No wheezing.  Abdominal:     General: Bowel sounds are normal.     Palpations: Abdomen is soft. There is no mass.     Tenderness: There is abdominal tenderness in the epigastric area. There is no guarding or rebound.     Comments: Mild epigastric TTP.  Genitourinary:    Rectum: Normal. No mass, tenderness, anal fissure, external hemorrhoid or internal hemorrhoid. Normal anal tone.     Comments: RN chaperone present for rectal exam.  No appreciable tenderness to palpation noted to rectum.  No appreciable external hemorrhoid, internal hemorrhoid, mass, anal  fissure, abnormal anal tone.  No gross blood noted to rectal vault. Musculoskeletal:        General: Normal range of motion.     Cervical back: Normal range of motion and neck supple.  Skin:    General: Skin is warm and dry.  Neurological:     Mental Status: She is alert.  Psychiatric:        Behavior: Behavior normal.     ED Results / Procedures / Treatments   Labs (all labs ordered are listed, but only abnormal results are displayed) Labs Reviewed  COMPREHENSIVE METABOLIC PANEL - Abnormal; Notable for the following components:      Result Value   Calcium 8.8 (*)    All other components within normal limits  CBC WITH DIFFERENTIAL/PLATELET  LIPASE, BLOOD  URINALYSIS, ROUTINE W REFLEX MICROSCOPIC  I-STAT BETA HCG BLOOD, ED  (MC, WL, AP ONLY)  POC OCCULT BLOOD, ED    EKG EKG Interpretation  Date/Time:  Tuesday December 28 2021 09:16:05 EST Ventricular Rate:  82 PR Interval:  144 QRS Duration: 76 QT Interval:  376 QTC Calculation: 439 R Axis:   92 Text Interpretation: Normal sinus rhythm with sinus arrhythmia Rightward axis Borderline ECG When compared with ECG of 21-Oct-2019 12:07, PREVIOUS ECG IS PRESENT similar to prior no stemi Confirmed by Tanda Rockers ([id number]) on 12/28/2021 6:00:29 PM  Radiology US Abdomen Limited RUQ (LIVER/GB)  Result Date: 12/28/2021 CLINICAL DATA:  Epigastric pain EXAM: ULTRASOUND ABDOMEN LIMITED RIGHT UPPER QUADRANT COMPARISON:  None Available. FINDINGS: Gallbladder: No gallstones or wall thickening visualized. No sonographic Murphy sign noted by sonographer. Common bile duct: Diameter: 2.4 mm Liver: No focal lesion identified. Within normal limits in parenchymal echogenicity. Portal vein is patent on color Doppler imaging with normal direction of blood flow towards the liver. Other: None. IMPRESSION: Normal study. No cause for epigastric pain identified. Electronically Signed   By: Gerome Sam III M.D.   On: 12/28/2021 16:24    Procedures Procedures    Medications Ordered in ED Medications  sodium chloride 0.9 % bolus 1,000 mL (0 mLs Intravenous Stopped 12/28/21 1547)  ondansetron (ZOFRAN) injection 4 mg (4 mg Intravenous Given 12/28/21 1423)  famotidine (PEPCID) IVPB 20 mg premix (0 mg Intravenous Stopped 12/28/21 1547)  alum & mag hydroxide-simeth (MAALOX/MYLANTA) 200-200-20 MG/5ML suspension 30 mL (30 mLs Oral Given 12/28/21 1423)  oxyCODONE (Oxy IR/ROXICODONE) immediate release tablet 5 mg (5 mg Oral Given 12/28/21 1658)    ED Course/ Medical Decision Making/ A&P Clinical Course as of 12/28/21 1852  Tue Dec 28, 2021  1336 Confirm with pharmacist, daughter who notes that meds are safe in breast-feeding mother. [SB]  1510 Pt re-evaluated and notes that she still has  pain.  [SB]  1534 Of note, patient notes that she would like her gallbladder evaluated [SB]  1534 aldd [SB]  1558 Discussed with patient regarding use of narcotics.  Discussed with patient that we can provide her with a Percocet or Norco here.  Patient notes that she does not want that medication at this time due to her son breast-feeding and her not having formula at this time.  Discussed with patient that we can start with a gram of Tylenol so that patient can continue to breast-feed her son and should she have continued pain then we can discuss oxycodone.  Patient agreeable at this time. [SB]  1635 Discussed with patient regarding negative [place] and patient noted that she would like CT AP at this time.  [SB]  1755 Reevaluated and noted that her pain has not improved with oxycodone.  Notes that she has a history of IBS that was previously treated through [facility].  [SB]  1828 Fecal Occult Blood, POC: NEGATIVE Discussed with patient negative results. Pt notes that she is still having the sharp pain, will treat with fentanyl. Pt agreeable. [SB]    Clinical Course User Index [SB] Bethanne Mule A, PA-C                           Medical Decision Making Amount and/or Complexity of Data Reviewed Labs: ordered. Decision-making details documented in ED Course. Radiology: ordered.  Risk OTC drugs. Prescription drug management.   Patient presents to the emergency department with epigastric abdominal pain onset 5 days. Patient is 3 months postpartum.  On exam patient with mild epigastric tenderness to palpation.  Remainder of exam without acute findings.  Pt afebrile. Differential diagnosis includes pancreatitis, cholecystitis, appendicitis, GERD, GI bleed.  Labs:  I ordered, and personally interpreted labs.  The pertinent results include:  Lipase, CBC, CMP negative. Beta-hCG negative Urinalysis ordered with results pending at time of signout  Imaging: I ordered imaging studies including [place] RUQ, CT AP  w contrast I independently visualized and interpreted imaging which showed RUQ [place] without acute findings. CT AP ordered with results pending at time of signout.  I agree with the radiologist interpretation  Medications:  I ordered medication including oxycodone, GI cocktail, Zofran, IV fluids, Pepcid for symptom management. Reevaluation of the patient after these medicines and interventions, I reevaluated the patient and found that they have stayed the same I have reviewed the patients home medicines and have made adjustments as needed Tolerated PO challenge in ED with above treatment regimen   Patient case discussed with Dr. Wallace Cullens, at sign-out. Plan at sign-out is pending CT and disposition as per CT exam findings, however, plans may change as per oncoming team. Patient care transferred at sign out.    This chart was dictated using voice recognition software, Dragon. Despite the best efforts of this provider to proofread and correct errors, errors may still occur which can change documentation meaning.  Final Clinical Impression(s) / ED Diagnoses Final diagnoses:  Epigastric abdominal pain    Rx / DC Orders ED Discharge Orders     None         Antonella Upson A, PA-C 12/28/21 1852    Morgana Rowley A, PA-C 12/28/21 1854    Sloan Leiter, DO 12/28/21 1919    Sloan Leiter, DO 12/28/21 1933

## 2021-12-28 NOTE — ED Provider Triage Note (Cosign Needed Addendum)
Emergency Medicine Provider Triage Evaluation Note  Bailey [name] , [name] 26 y.o. female  was evaluated in triage.  Pt complains of epigastric abdominal pain onset 5 days and worsening this morning. Notes that the pain is sharp and shooting.  Notes that she has had blood mixed in with her stool x 2 episodes.  Has [name] history of IBS and typically has blood mixed in with her stool with her IBS episodes.  Has been evaluated by [facility] for this before.  Has nausea and emesis.  Denies diarrhea, chest pain, shortness of breath.  No past medical history of GERD.  Patient is 3 months postpartum.   Review of Systems  Positive:  Negative:   Physical Exam  BP 130/86 (BP Location: Right Wrist)   Pulse 78   Temp 98.1 F (36.7 C) (Oral)   Resp 18   Ht 5\' 5"  (1.651 m)   Wt 107 kg   SpO2 100%   BMI 39.25 kg/m  Gen:   Awake, no distress   Resp:  Normal effort  MSK:   Moves extremities without difficulty  Other:  Mild epigastric tenderness to palpation  Medical Decision Making  Medically screening exam initiated at 9:30 AM.  Appropriate orders placed.  Bailey [name] was informed that the remainder of the evaluation will be completed by another provider, this initial triage assessment does not replace that evaluation, and the importance of remaining in the ED until their evaluation is complete.    Bailey [place] A, PA-C 12/28/21 0930    Bailey Cordaro A, PA-C 12/28/21 1748    Bailey Gloster A, PA-C 12/28/21 1853

## 2022-05-14 ENCOUNTER — Emergency Department (HOSPITAL_COMMUNITY): Payer: Medicaid Other

## 2022-05-14 ENCOUNTER — Encounter (HOSPITAL_COMMUNITY): Payer: Self-pay | Admitting: Emergency Medicine

## 2022-05-14 ENCOUNTER — Other Ambulatory Visit: Payer: Self-pay

## 2022-05-14 ENCOUNTER — Emergency Department (HOSPITAL_COMMUNITY)
Admission: EM | Admit: 2022-05-14 | Discharge: 2022-05-15 | Disposition: A | Discharge status: Home / Self Care | Payer: Medicaid Other | Attending: Student | Admitting: Student

## 2022-05-14 DIAGNOSIS — R1010 Upper abdominal pain, unspecified: Secondary | ICD-10-CM | POA: Diagnosis present

## 2022-05-14 DIAGNOSIS — F1290 Cannabis use, unspecified, uncomplicated: Secondary | ICD-10-CM | POA: Diagnosis not present

## 2022-05-14 DIAGNOSIS — R197 Diarrhea, unspecified: Secondary | ICD-10-CM | POA: Insufficient documentation

## 2022-05-14 DIAGNOSIS — R112 Nausea with vomiting, unspecified: Secondary | ICD-10-CM | POA: Diagnosis not present

## 2022-05-14 LAB — COMPREHENSIVE METABOLIC PANEL
ALT: 15 U/L (ref 0–44)
AST: 16 U/L (ref 15–41)
Albumin: 4.1 g/dL (ref 3.5–5.0)
Alkaline Phosphatase: 87 U/L (ref 38–126)
Anion gap: 6 (ref 5–15)
BUN: 11 mg/dL (ref 6–20)
CO2: 22 mmol/L (ref 22–32)
Calcium: 8.9 mg/dL (ref 8.9–10.3)
Chloride: 107 mmol/L (ref 98–111)
Creatinine, Ser: 0.85 mg/dL (ref 0.44–1.00)
GFR, Estimated: >60 mL/min (ref >60)
Glucose, Bld: 84 mg/dL (ref 70–99)
Potassium: 3.7 mmol/L (ref 3.5–5.1)
Sodium: 135 mmol/L (ref 135–145)
Total Bilirubin: 0.7 mg/dL (ref 0.3–1.2)
Total Protein: 8.7 g/dL — ABNORMAL HIGH (ref 6.5–8.1)

## 2022-05-14 LAB — HCG, QUANTITATIVE, PREGNANCY: hCG, Beta Chain, Quant, S: <1 m[IU]/mL (ref <5)

## 2022-05-14 LAB — CBC
HCT: 45.2 % (ref 36.0–46.0)
Hemoglobin: 14.5 g/dL (ref 12.0–15.0)
MCH: 29.1 pg (ref 26.0–34.0)
MCHC: 32.1 g/dL (ref 30.0–36.0)
MCV: 90.6 fL (ref 80.0–100.0)
Platelets: 274 10*3/uL (ref 150–400)
RBC: 4.99 MIL/uL (ref 3.87–5.11)
RDW: 12.9 % (ref 11.5–15.5)
WBC: 8.4 10*3/uL (ref 4.0–10.5)
nRBC: 0 % (ref 0.0–0.2)

## 2022-05-14 LAB — TROPONIN I (HIGH SENSITIVITY): Troponin I (High Sensitivity): <2 ng/L (ref <18)

## 2022-05-14 LAB — LIPASE, BLOOD: Lipase: 58 U/L — ABNORMAL HIGH (ref 11–51)

## 2022-05-14 MED ORDER — FAMOTIDINE IN NACL 20-0.9 MG/50ML-% IV SOLN
20.0000 mg | Freq: Once | INTRAVENOUS | Status: AC
  Administered 2022-05-14: 20 mg via INTRAVENOUS
  Filled 2022-05-14: qty 50

## 2022-05-14 MED ORDER — KETOROLAC TROMETHAMINE 15 MG/ML IJ SOLN
15.0000 mg | Freq: Once | INTRAMUSCULAR | Status: AC
  Administered 2022-05-14: 15 mg via INTRAVENOUS
  Filled 2022-05-14: qty 1

## 2022-05-14 MED ORDER — ONDANSETRON HCL 4 MG/2ML IJ SOLN
4.0000 mg | Freq: Once | INTRAMUSCULAR | Status: AC
  Administered 2022-05-14: 4 mg via INTRAVENOUS
  Filled 2022-05-14: qty 2

## 2022-05-14 MED ORDER — DICYCLOMINE HCL 10 MG PO CAPS
20.0000 mg | ORAL_CAPSULE | Freq: Once | ORAL | Status: AC
  Administered 2022-05-14: 20 mg via ORAL
  Filled 2022-05-14: qty 2

## 2022-05-14 MED ORDER — LACTATED RINGERS IV BOLUS
1000.0000 mL | Freq: Once | INTRAVENOUS | Status: AC
  Administered 2022-05-14: 1000 mL via INTRAVENOUS

## 2022-05-14 MED ORDER — SODIUM CHLORIDE 0.9 % IV BOLUS
1000.0000 mL | Freq: Once | INTRAVENOUS | Status: AC
  Administered 2022-05-14: 1000 mL via INTRAVENOUS

## 2022-05-14 MED ORDER — DROPERIDOL 2.5 MG/ML IJ SOLN
1.2500 mg | Freq: Once | INTRAMUSCULAR | Status: AC
  Administered 2022-05-14: 1.25 mg via INTRAVENOUS
  Filled 2022-05-14: qty 2

## 2022-05-14 MED ORDER — IOHEXOL 300 MG/ML  SOLN
100.0000 mL | Freq: Once | INTRAMUSCULAR | Status: AC | PRN
  Administered 2022-05-14: 100 mL via INTRAVENOUS

## 2022-05-14 MED ORDER — ALUM & MAG HYDROXIDE-SIMETH 200-200-20 MG/5ML PO SUSP
30.0000 mL | Freq: Once | ORAL | Status: AC
  Administered 2022-05-14: 30 mL via ORAL
  Filled 2022-05-14: qty 30

## 2022-05-14 MED ORDER — MORPHINE SULFATE (PF) 2 MG/ML IV SOLN
2.0000 mg | Freq: Once | INTRAVENOUS | Status: AC
  Administered 2022-05-14: 2 mg via INTRAVENOUS
  Filled 2022-05-14: qty 1

## 2022-05-14 MED ORDER — DIPHENHYDRAMINE HCL 50 MG/ML IJ SOLN
12.5000 mg | Freq: Once | INTRAMUSCULAR | Status: AC
  Administered 2022-05-14: 12.5 mg via INTRAVENOUS
  Filled 2022-05-14: qty 1

## 2022-05-14 MED ORDER — LORAZEPAM 2 MG/ML IJ SOLN
0.5000 mg | Freq: Once | INTRAMUSCULAR | Status: AC
  Administered 2022-05-14: 0.5 mg via INTRAVENOUS
  Filled 2022-05-14: qty 1

## 2022-05-14 MED ORDER — ACETAMINOPHEN 500 MG PO TABS
1000.0000 mg | ORAL_TABLET | Freq: Once | ORAL | Status: DC
  Filled 2022-05-14: qty 2

## 2022-05-14 NOTE — ED Provider Notes (Signed)
Bailey [name] EMERGENCY DEPARTMENT AT [facility] Provider Note   CSN: [id number] Arrival date & time: 05/14/22  1456     History  Chief Complaint  Patient presents with   Abdominal Pain   Emesis   Diarrhea    Bailey [name] is a 27 y.o. female who presents emergency department with concerns for upper abdominal pain onset last night.  Patient thinks it is her gallbladder causing her concern.  She has been evaluated multiple times for this before in the past.  She still has her gallbladder.  Has associated emesis, watery diarrhea, nausea.  Has tried over-the-counter Emetrol without relief of her symptoms.  Endorses marijuana use socially.  Denies fever, urinary symptoms, constipation.  Does not have a GI specialist at this time.  Patient is currently breast-feeding.  The history is provided by the patient. No language interpreter was used.       Home Medications Prior to Admission medications   Medication Sig Start Date End Date Taking? Authorizing Provider  Prenatal Vit-Fe Fumarate-FA (PRENATAL MULTIVITAMIN) TABS tablet Take 1 tablet by mouth daily at 12 noon.   Yes [provider]  dicyclomine (BENTYL) 20 MG tablet Take 1 tablet (20 mg total) by mouth 2 (two) times daily for 7 days. Please do not breast feed while on this medication 12/28/21 01/04/22  Bailey [name] A, DO  famotidine (PEPCID) 20 MG tablet Take 1 tablet (20 mg total) by mouth 2 (two) times daily. Patient not taking: Reported on 05/14/2022 05/15/21   Bailey [name], CNM  sucralfate (CARAFATE) 1 g tablet Take 1 tablet (1 g total) by mouth with breakfast, with lunch, and with evening meal for 5 days. Patient not taking: Reported on 05/14/2022 12/28/21 01/02/22  Bailey Leiter, DO      Allergies    Blackberry [rubus fruticosus], Blueberry [vaccinium angustifolium], Other, and Raspberry    Review of Systems   Review of Systems  Constitutional:  Negative for fever.  Gastrointestinal:  Positive for  abdominal pain, diarrhea and vomiting. Negative for constipation.  Genitourinary:  Negative for dysuria and hematuria.  All other systems reviewed and are negative.   Physical Exam Updated Vital Signs BP 110/80   Pulse 99   Temp 97.9 F (36.6 C) (Oral)   Resp 18   SpO2 100%  Physical Exam Vitals and nursing note reviewed.  Constitutional:      General: She is not in acute distress.    Appearance: She is not diaphoretic.  HENT:     Head: Normocephalic and atraumatic.     Mouth/Throat:     Pharynx: No oropharyngeal exudate.  Eyes:     General: No scleral icterus.    Conjunctiva/sclera: Conjunctivae normal.  Cardiovascular:     Rate and Rhythm: Normal rate and regular rhythm.     Pulses: Normal pulses.     Heart sounds: Normal heart sounds.  Pulmonary:     Effort: Pulmonary effort is normal. No respiratory distress.     Breath sounds: Normal breath sounds. No wheezing.  Abdominal:     General: Bowel sounds are normal.     Palpations: Abdomen is soft. There is no mass.     Tenderness: There is generalized abdominal tenderness. There is no guarding or rebound.     Comments: Diffuse abdominal TTP, more so to upper abdomen.  Musculoskeletal:        General: Normal range of motion.     Cervical back: Normal range of motion and neck  supple.  Skin:    General: Skin is warm and dry.  Neurological:     Mental Status: She is alert.  Psychiatric:        Behavior: Behavior normal.     ED Results / Procedures / Treatments   Labs (all labs ordered are listed, but only abnormal results are displayed) Labs Reviewed  LIPASE, BLOOD - Abnormal; Notable for the following components:      Result Value   Lipase 58 (*)    All other components within normal limits  COMPREHENSIVE METABOLIC PANEL - Abnormal; Notable for the following components:   Total Protein 8.7 (*)    All other components within normal limits  CBC  HCG, QUANTITATIVE, PREGNANCY  URINALYSIS, ROUTINE W REFLEX  MICROSCOPIC  TROPONIN I (HIGH SENSITIVITY)    EKG None  Radiology No results found.  Procedures Procedures    Medications Ordered in ED Medications  acetaminophen (TYLENOL) tablet 1,000 mg (1,000 mg Oral Patient Refused/Not Given 05/14/22 1547)  alum & mag hydroxide-simeth (MAALOX/MYLANTA) 200-200-20 MG/5ML suspension 30 mL (has no administration in time range)  famotidine (PEPCID) IVPB 20 mg premix (has no administration in time range)  sodium chloride 0.9 % bolus 1,000 mL (0 mLs Intravenous Stopped 05/14/22 1649)  ondansetron (ZOFRAN) injection 4 mg (4 mg Intravenous Given 05/14/22 1546)  morphine (PF) 2 MG/ML injection 2 mg (2 mg Intravenous Given 05/14/22 1725)    ED Course/ Medical Decision Making/ A&P Clinical Course as of 05/14/22 1735  Sat May 14, 2022  1619 In depth conversation with patient held at bedside regarding pain medication.  At this time patient notes that her child is currently 4 months old and she is in unbearable pain right now and would rather proceed with pain management at this time.  She does her child is eating table foods and can be bottle fed in the interim. [SB]    Clinical Course User Index [SB] Bailey [name] A, PA-C                             Medical Decision Making Amount and/or Complexity of Data Reviewed Labs: ordered. Radiology: ordered.  Risk OTC drugs. Prescription drug management.   Patient presents to the emergency department with concerns for upper abdominal pain onset last night. Pt afebrile. On exam patient with diffuse abdominal TTP, more so to upper abdomen. Remainder of exam without acute findings. Differential diagnosis includes pancreatitis, cholecystitis, appendicitis, UTI, GERD, cannabinoid hyperemesis syndrome.   Additional history obtained:  External records from outside source obtained and reviewed including: Patient has been evaluated multiple times for similar symptoms in the past in December as well as January with  negative workups.  Labs:  I ordered, and personally interpreted labs.  The pertinent results include:  Negative hcg Initial trop ordered with results pending at time of sign out.  Lipase slightly elevated at 58 CMP and CBC unremarkable  Imaging: I ordered imaging studies including CT AP ordered with results pending at time of sign out   Medications:  I ordered medication including IVF, zofran, morphine, pepcid, GI cocktail for symptom management. I have reviewed the patients home medicines and have made adjustments as needed   Patient case discussed with Dr. Posey Rea, at sign-out. Plan at sign-out is pending pain management and CT AP, likely Discharge home, however, plans may change as per oncoming team. Patient care transferred at sign out.    This chart was dictated using  voice recognition software, Nurse, children's. Despite the best efforts of this provider to proofread and correct errors, errors may still occur which can change documentation meaning.   Final Clinical Impression(s) / ED Diagnoses Final diagnoses:  Upper abdominal pain  Nausea vomiting and diarrhea    Rx / DC Orders ED Discharge Orders     None         Ersa Delaney A, PA-C 05/14/22 1736    Kommor, Wyn Forster, MD 05/15/22 [phone]

## 2022-05-14 NOTE — ED Triage Notes (Signed)
Patient presents due to gallbladder pain, emesis and diarrhea. Medication has little affect. She has been vomiting since last night. She has been seen multiple times for this.

## 2022-05-14 NOTE — ED Provider Notes (Signed)
Care assumed from Dr. Posey Rea, patient with abdominal pain, nausea, vomiting and difficulty controlling vomiting.  May need admission if unable to control pain and nausea.  She had recurrence of pain, but I noted she only had a very modest dose of morphine.  I gave her a larger dose of morphine and she has done well from a pain standpoint.  She has also not had any further vomiting.  Unfortunately, droperidol cannot be given at home.  She has tolerated oral fluids in the ED.  I am discharging her with prescriptions for prochlorperazine tablet and suppository, small number of hydrocodone-acetaminophen for pain control.  I am also starting her on a proton pump inhibitor.  I am referring her to gastroenterology.  Importance of abstaining from cannabis was stressed.   Dione Booze, MD 05/15/22 [phone]

## 2022-05-15 MED ORDER — PROCHLORPERAZINE MALEATE 10 MG PO TABS: 10.0000 mg | ORAL_TABLET | Freq: Four times a day (QID) | ORAL | 0 refills | Status: AC | PRN

## 2022-05-15 MED ORDER — PANTOPRAZOLE SODIUM 40 MG PO TBEC: 40.0000 mg | DELAYED_RELEASE_TABLET | Freq: Every day | ORAL | 0 refills | Status: AC

## 2022-05-15 MED ORDER — PANTOPRAZOLE SODIUM 40 MG IV SOLR
40.0000 mg | Freq: Once | INTRAVENOUS | Status: AC
  Administered 2022-05-15: 40 mg via INTRAVENOUS
  Filled 2022-05-15: qty 10

## 2022-05-15 MED ORDER — MORPHINE SULFATE (PF) 4 MG/ML IV SOLN
4.0000 mg | Freq: Once | INTRAVENOUS | Status: AC
  Administered 2022-05-15: 4 mg via INTRAVENOUS
  Filled 2022-05-15: qty 1

## 2022-05-15 MED ORDER — PROCHLORPERAZINE 25 MG RE SUPP: 25.0000 mg | Freq: Two times a day (BID) | RECTAL | 0 refills | Status: AC | PRN

## 2022-05-15 MED ORDER — HYDROCODONE-ACETAMINOPHEN 5-325 MG PO TABS: 1.0000 | ORAL_TABLET | ORAL | 0 refills | Status: AC | PRN

## 2022-05-15 NOTE — ED Notes (Signed)
Patient provided with crackers and juice for second attempt at PO challenge.  Patient reports pain is returning and she does not feel she will be able to tolerate PO intake at this time.  Patient reminded of need for urine sample and continues to state that she does not feel like she is able to urinate.

## 2022-05-15 NOTE — ED Notes (Signed)
Patient resting quietly with eyes closed.  Respirations even and unlabored.  °

## 2022-05-15 NOTE — Discharge Instructions (Signed)
You may take loperamide (Imodium A-D) as needed for diarrhea.  It is very important that you keep yourself hydrated.  Drink small amounts frequently.  Prochlorperazine and hydrocodone-acetaminophen are excreted in breast milk.  If you take either of these, you should pump your breasts and discard the milk until it has been 24 hours since the last dose.  Return if symptoms are not being adequately managed at home.
